# Patient Record
Sex: Male | Born: 1945 | Race: White | Hispanic: No | Smoking: Never smoker
Health system: Southern US, Community
[De-identification: ages and names within clinical notes are randomized; demographics above are authoritative.]

## PROBLEM LIST (undated history)

## (undated) DIAGNOSIS — C61 Malignant neoplasm of prostate: Secondary | ICD-10-CM

## (undated) DIAGNOSIS — C4490 Unspecified malignant neoplasm of skin, unspecified: Secondary | ICD-10-CM

## (undated) DIAGNOSIS — I10 Essential (primary) hypertension: Secondary | ICD-10-CM

## (undated) DIAGNOSIS — E039 Hypothyroidism, unspecified: Secondary | ICD-10-CM

## (undated) HISTORY — PX: CATARACT EXTRACTION, BILATERAL: SHX1313

## (undated) HISTORY — DX: Essential (primary) hypertension: I10

## (undated) HISTORY — DX: Unspecified malignant neoplasm of skin, unspecified: C44.90

## (undated) HISTORY — DX: Malignant neoplasm of prostate: C61

## (undated) HISTORY — DX: Hypothyroidism, unspecified: E03.9

---

## 2013-08-18 ENCOUNTER — Encounter (HOSPITAL_COMMUNITY): Payer: Self-pay | Admitting: *Deleted

## 2013-08-18 ENCOUNTER — Emergency Department (HOSPITAL_COMMUNITY)
Admission: EM | Admit: 2013-08-18 | Discharge: 2013-08-18 | Disposition: A | Discharge status: Home / Self Care | Payer: Self-pay | Attending: Emergency Medicine | Admitting: Emergency Medicine

## 2013-08-18 ENCOUNTER — Emergency Department (HOSPITAL_COMMUNITY): Payer: Self-pay

## 2013-08-18 DIAGNOSIS — Z79899 Other long term (current) drug therapy: Secondary | ICD-10-CM | POA: Insufficient documentation

## 2013-08-18 DIAGNOSIS — W11XXXA Fall on and from ladder, initial encounter: Secondary | ICD-10-CM | POA: Insufficient documentation

## 2013-08-18 DIAGNOSIS — Y9289 Other specified places as the place of occurrence of the external cause: Secondary | ICD-10-CM | POA: Insufficient documentation

## 2013-08-18 DIAGNOSIS — S52599A Other fractures of lower end of unspecified radius, initial encounter for closed fracture: Secondary | ICD-10-CM | POA: Insufficient documentation

## 2013-08-18 DIAGNOSIS — S52501A Unspecified fracture of the lower end of right radius, initial encounter for closed fracture: Secondary | ICD-10-CM

## 2013-08-18 DIAGNOSIS — Y9389 Activity, other specified: Secondary | ICD-10-CM | POA: Insufficient documentation

## 2013-08-18 MED ORDER — OXYCODONE-ACETAMINOPHEN 5-325 MG PO TABS: 1.0000 | ORAL_TABLET | Freq: Four times a day (QID) | ORAL | Status: DC | PRN

## 2013-08-18 MED ORDER — ONDANSETRON 4 MG PO TBDP
4.0000 mg | ORAL_TABLET | Freq: Once | ORAL | Status: AC
  Administered 2013-08-18: 4 mg via ORAL
  Filled 2013-08-18: qty 1

## 2013-08-18 MED ORDER — ONDANSETRON HCL 4 MG PO TABS: 4.0000 mg | ORAL_TABLET | Freq: Four times a day (QID) | ORAL | Status: DC

## 2013-08-18 MED ORDER — OXYCODONE-ACETAMINOPHEN 5-325 MG PO TABS
2.0000 | ORAL_TABLET | Freq: Once | ORAL | Status: AC
  Administered 2013-08-18: 2 via ORAL
  Filled 2013-08-18: qty 2

## 2013-08-18 NOTE — ED Provider Notes (Signed)
CSN: [id number]     Arrival date & time 08/18/13  1713 History  This chart was scribed for non-physician practitioner Marlon Pel, PA-C working with Dagmar Hait, MD by Danella Maiers, ED Scribe. This patient was seen in room TR10C/TR10C and the patient's care was started at 5:31 PM.   Chief Complaint  Patient presents with  . Wrist Pain   The history is provided by the patient. No language interpreter was used.   HPI Comments: Jon Ramirez is a 67 y.o. male who presents to the Emergency Department complaining of worsening, sudden-onset right hand and wrist pain after falling off a ladder from 6 feet two days ago. The ladder was on an unstable branch and he reports going straight down.  Pt reports he broke the fall with his hands. He didn't loose consciousness or have head/neck injury. He didn't get seen at the time because the pain was not significant. He felt relatively fine yesterday and then today the swelling and pain became severe and he was unable to tolerate it any long. He has been taking ibuprofen which is not working. Pt still has ring on right hand ring finger that he is unable to get off. He denies having any other complaints. He denies being unable to feel or move his fingers. He denies significant past medical history. NAD/VSS     History reviewed. No pertinent past medical history. History reviewed. No pertinent past surgical history. History reviewed. No pertinent family history. History  Substance Use Topics  . Smoking status: Never Smoker   . Smokeless tobacco: Not on file  . Alcohol Use: No    Review of Systems  Musculoskeletal: Positive for arthralgias (right wrist).  Neurological: Negative for syncope.  All other systems reviewed and are negative.    Allergies  Review of patient's allergies indicates no known allergies.  Home Medications   Current Outpatient Rx  Name  Route  Sig  Dispense  Refill  . ibuprofen (ADVIL,MOTRIN) 200 MG tablet    Oral   Take 200 mg by mouth every 6 (six) hours as needed for pain (pain).         . Multiple Vitamins-Minerals (MULTIVITAMIN WITH MINERALS) tablet   Oral   Take 1 tablet by mouth daily.          BP 161/78  Pulse 88  Temp(Src) 98.4 F (36.9 C) (Oral)  Resp 20  SpO2 98% Physical Exam  Nursing note and vitals reviewed. Constitutional: He is oriented to person, place, and time. He appears well-developed and well-nourished. No distress.  HENT:  Head: Normocephalic and atraumatic.  Eyes: EOM are normal.  Neck: Neck supple. No tracheal deviation present.  Cardiovascular: Normal rate.   Pulmonary/Chest: Effort normal. No respiratory distress.  Musculoskeletal: Normal range of motion.  Patient has significant swelling and some deformity to the right wrist. He is unable to tolerate moving his wrist. He has intact sensation to all 5 fingers with cap refill < 3 second. The fingers are all warm, moist, and pink.  He is unable to tolerate evaluating grip strength.  Neurological: He is alert and oriented to person, place, and time.  Skin: Skin is warm and dry.  Psychiatric: He has a normal mood and affect. His behavior is normal.    ED Course  Procedures (including critical care time) Medications  oxyCODONE-acetaminophen (PERCOCET/ROXICET) 5-325 MG per tablet 2 tablet (not administered)  ondansetron (ZOFRAN-ODT) disintegrating tablet 4 mg (not administered)    DIAGNOSTIC STUDIES: Oxygen Saturation is  98% on RA, normal by my interpretation.    COORDINATION OF CARE: 6:33 PM- Discussed treatment plan with pt which includes consulting Dr. Izora Ribas, the hand surgeon, and treatment with pain meds. Pt agrees to plan. Pt given 2 Percocet's for pain, declined shot of Morphine.  6:57 PM- Dr. Izora Ribas consulted to discuss fracture.  pt to call Dr. Debby Bud office tmrw to schedule follow-up appointment    Labs Review Labs Reviewed - No data to display Imaging Review Dg Wrist Complete  Right  08/18/2013   CLINICAL DATA:  Fall from ladder. Wrist pain.  EXAM: RIGHT WRIST - COMPLETE 3+ VIEW  COMPARISON:  None.  FINDINGS: Comminuted distal radius fracture is present with intra-articular extension to the radiocarpal joint and distal radioulnar joint. Mildly displaced ulnar styloid fracture is present. There is apex volar angulation of the distal radius fracture with complete loss of normal volar tibial and obvious soft tissue swelling. Dorsal hematoma is present at the wrist. STT joint osteoarthritis is present. Carpal bones appear intact.  IMPRESSION: Comminuted apex dorsally angulated distal radius fracture with intra-articular extension. Mildly displaced ulnar styloid fracture.   Electronically Signed   By: Andreas Newport M.D.   On: 08/18/2013 18:24   Dg Hand Complete Right  08/18/2013   CLINICAL DATA:  Fall. Hand pain. Wrist pain.  EXAM: RIGHT HAND - COMPLETE 3+ VIEW  COMPARISON:  Wrist radiographs today.  FINDINGS: Alignment of bones of the hand is anatomic. STT joint osteoarthritis. Metacarpals and phalanges appear intact. The triquetrum appears normal.  IMPRESSION: Distal radius fracture.   Electronically Signed   By: Andreas Newport M.D.   On: 08/18/2013 18:25    MDM   1. Distal radial fracture, right, closed, initial encounter      RING REMOVAL lubraicant was applied to patients right ring finger and ring was removed without any adverse event. Patients fingers swelling decreased. Cap refill is < 2 seconds.   Right short arm sugar tong applied, sling given for comfort.  Rx: percocet and zofran.  66 y.o.Jon Ramirez's evaluation in the Emergency Department is complete. It has been determined that no acute conditions requiring further emergency intervention are present at this time. The patient/guardian have been advised of the diagnosis and plan. We have discussed signs and symptoms that warrant return to the ED, such as changes or worsening in symptoms.  Vital signs  are stable at discharge. Filed Vitals:   08/18/13 1724  BP: 161/78  Pulse: 88  Temp: 98.4 F (36.9 C)  Resp: 20    Patient/guardian has voiced understanding and agreed to follow-up with the PCP or specialist.  I personally performed the services described in this documentation, which was scribed in my presence. The recorded information has been reviewed and is accurate.     Dorthula Matas, PA-C 08/18/13 1904

## 2013-08-18 NOTE — ED Provider Notes (Signed)
Medical screening examination/treatment/procedure(s) were performed by non-physician practitioner and as supervising physician I was immediately available for consultation/collaboration.   Dagmar Hait, MD 08/18/13 [phone]

## 2013-08-18 NOTE — ED Notes (Signed)
Patient transported to X-ray 

## 2013-08-18 NOTE — Progress Notes (Signed)
Orthopedic Tech Progress Note Patient Details:  Jon Ramirez 1946/01/05 [id number]  Ortho Devices Type of Ortho Device: Ace wrap;Arm sling;Sugartong splint Ortho Device/Splint Location: rue Ortho Device/Splint Interventions: Application   Jon Ramirez 08/18/2013, 7:22 PM

## 2013-08-18 NOTE — ED Notes (Signed)
Pt reports falling on Friday and breaking his fall with hands, having pain to right hand and wrist since, swelling noted, possible fx to right wrist. Cms intact, +right radial pulse and able to move digits.

## 2014-01-30 ENCOUNTER — Other Ambulatory Visit: Payer: Self-pay | Admitting: Family Medicine

## 2014-01-30 DIAGNOSIS — Z139 Encounter for screening, unspecified: Secondary | ICD-10-CM

## 2014-02-07 ENCOUNTER — Ambulatory Visit: Payer: Self-pay

## 2015-02-03 ENCOUNTER — Inpatient Hospital Stay (HOSPITAL_COMMUNITY)
Admission: EM | Admit: 2015-02-03 | Discharge: 2015-02-05 | DRG: 377 | Discharge status: Against Medical Advice | Payer: Medicare Other | Attending: Oncology | Admitting: Oncology

## 2015-02-03 ENCOUNTER — Other Ambulatory Visit (HOSPITAL_COMMUNITY): Payer: Self-pay

## 2015-02-03 ENCOUNTER — Emergency Department (HOSPITAL_COMMUNITY): Payer: Medicare Other

## 2015-02-03 ENCOUNTER — Encounter (HOSPITAL_COMMUNITY)
Admission: EM | Discharge status: Against Medical Advice | Payer: Self-pay | Source: Home / Self Care | Attending: Oncology

## 2015-02-03 ENCOUNTER — Encounter (HOSPITAL_COMMUNITY): Payer: Self-pay | Admitting: Emergency Medicine

## 2015-02-03 DIAGNOSIS — E43 Unspecified severe protein-calorie malnutrition: Secondary | ICD-10-CM | POA: Diagnosis present

## 2015-02-03 DIAGNOSIS — Z6824 Body mass index (BMI) 24.0-24.9, adult: Secondary | ICD-10-CM | POA: Diagnosis not present

## 2015-02-03 DIAGNOSIS — B86 Scabies: Secondary | ICD-10-CM | POA: Diagnosis present

## 2015-02-03 DIAGNOSIS — E876 Hypokalemia: Secondary | ICD-10-CM | POA: Diagnosis present

## 2015-02-03 DIAGNOSIS — L821 Other seborrheic keratosis: Secondary | ICD-10-CM | POA: Diagnosis present

## 2015-02-03 DIAGNOSIS — K254 Chronic or unspecified gastric ulcer with hemorrhage: Principal | ICD-10-CM | POA: Diagnosis present

## 2015-02-03 DIAGNOSIS — Z79899 Other long term (current) drug therapy: Secondary | ICD-10-CM | POA: Diagnosis not present

## 2015-02-03 DIAGNOSIS — R21 Rash and other nonspecific skin eruption: Secondary | ICD-10-CM

## 2015-02-03 DIAGNOSIS — K210 Gastro-esophageal reflux disease with esophagitis: Secondary | ICD-10-CM | POA: Diagnosis present

## 2015-02-03 DIAGNOSIS — K920 Hematemesis: Secondary | ICD-10-CM | POA: Diagnosis present

## 2015-02-03 DIAGNOSIS — K921 Melena: Secondary | ICD-10-CM | POA: Diagnosis present

## 2015-02-03 DIAGNOSIS — R1013 Epigastric pain: Secondary | ICD-10-CM | POA: Diagnosis present

## 2015-02-03 DIAGNOSIS — K922 Gastrointestinal hemorrhage, unspecified: Secondary | ICD-10-CM | POA: Diagnosis present

## 2015-02-03 HISTORY — PX: ESOPHAGOGASTRODUODENOSCOPY: SHX5428

## 2015-02-03 LAB — URINALYSIS, ROUTINE W REFLEX MICROSCOPIC
Bilirubin Urine: NEGATIVE
GLUCOSE, UA: 100 mg/dL — AB
Hgb urine dipstick: NEGATIVE
Ketones, ur: 15 mg/dL — AB
Leukocytes, UA: NEGATIVE
Nitrite: NEGATIVE
Protein, ur: 100 mg/dL — AB
SPECIFIC GRAVITY, URINE: 1.021 (ref 1.005–1.030)
UROBILINOGEN UA: 1 mg/dL (ref 0.0–1.0)
pH: 8.5 — ABNORMAL HIGH (ref 5.0–8.0)

## 2015-02-03 LAB — ABO/RH: ABO/RH(D): O NEG

## 2015-02-03 LAB — CBC
HEMATOCRIT: 28.8 % — AB (ref 39.0–52.0)
HEMATOCRIT: 24.7 % — AB (ref 39.0–52.0)
HEMOGLOBIN: 9.7 g/dL — AB (ref 13.0–17.0)
Hemoglobin: 8.4 g/dL — ABNORMAL LOW (ref 13.0–17.0)
MCH: 29.4 pg (ref 26.0–34.0)
MCH: 29.8 pg (ref 26.0–34.0)
MCHC: 33.7 g/dL (ref 30.0–36.0)
MCHC: 34 g/dL (ref 30.0–36.0)
MCV: 87.3 fL (ref 78.0–100.0)
MCV: 87.6 fL (ref 78.0–100.0)
Platelets: 459 10*3/uL — ABNORMAL HIGH (ref 150–400)
Platelets: 369 10*3/uL (ref 150–400)
RBC: 3.3 MIL/uL — ABNORMAL LOW (ref 4.22–5.81)
RBC: 2.82 MIL/uL — ABNORMAL LOW (ref 4.22–5.81)
RDW: 14.2 % (ref 11.5–15.5)
RDW: 14.3 % (ref 11.5–15.5)
WBC: 12.2 10*3/uL — AB (ref 4.0–10.5)
WBC: 9.9 10*3/uL (ref 4.0–10.5)

## 2015-02-03 LAB — COMPREHENSIVE METABOLIC PANEL
ALBUMIN: 3.3 g/dL — AB (ref 3.5–5.2)
ALK PHOS: 83 U/L (ref 39–117)
ALT: 15 U/L (ref 0–53)
AST: 21 U/L (ref 0–37)
Anion gap: 12 (ref 5–15)
BUN: 11 mg/dL (ref 6–23)
CHLORIDE: 99 mmol/L (ref 96–112)
CO2: 29 mmol/L (ref 19–32)
CREATININE: 1.16 mg/dL (ref 0.50–1.35)
Calcium: 8.5 mg/dL (ref 8.4–10.5)
GFR calc Af Amer: 74 mL/min — ABNORMAL LOW (ref >90)
GFR calc non Af Amer: 64 mL/min — ABNORMAL LOW (ref >90)
Glucose, Bld: 136 mg/dL — ABNORMAL HIGH (ref 70–99)
Potassium: 2.5 mmol/L — CL (ref 3.5–5.1)
Sodium: 140 mmol/L (ref 135–145)
Total Bilirubin: 0.9 mg/dL (ref 0.3–1.2)
Total Protein: 6.4 g/dL (ref 6.0–8.3)

## 2015-02-03 LAB — D-DIMER, QUANTITATIVE: D-Dimer, Quant: 1.35 ug/mL-FEU — ABNORMAL HIGH (ref 0.00–0.48)

## 2015-02-03 LAB — URINE MICROSCOPIC-ADD ON

## 2015-02-03 LAB — TYPE AND SCREEN
ABO/RH(D): O NEG
ANTIBODY SCREEN: NEGATIVE

## 2015-02-03 LAB — LIPASE, BLOOD: Lipase: 26 U/L (ref 11–59)

## 2015-02-03 LAB — POC OCCULT BLOOD, ED: Fecal Occult Bld: POSITIVE — AB

## 2015-02-03 LAB — TROPONIN I: Troponin I: <0.03 ng/mL (ref <0.031)

## 2015-02-03 SURGERY — EGD (ESOPHAGOGASTRODUODENOSCOPY)
Anesthesia: Moderate Sedation

## 2015-02-03 MED ORDER — POTASSIUM CHLORIDE 10 MEQ/100ML IV SOLN
10.0000 meq | INTRAVENOUS | Status: AC
  Administered 2015-02-03 – 2015-02-04 (×2): 10 meq via INTRAVENOUS
  Filled 2015-02-03 (×2): qty 100

## 2015-02-03 MED ORDER — FENTANYL CITRATE 0.05 MG/ML IJ SOLN
INTRAMUSCULAR | Status: DC | PRN
  Administered 2015-02-03 (×2): 25 ug via INTRAVENOUS

## 2015-02-03 MED ORDER — MIDAZOLAM HCL 10 MG/2ML IJ SOLN
INTRAMUSCULAR | Status: DC | PRN
  Administered 2015-02-03 (×2): 2 mg via INTRAVENOUS
  Administered 2015-02-03: 1 mg via INTRAVENOUS

## 2015-02-03 MED ORDER — ONDANSETRON HCL 4 MG PO TABS
4.0000 mg | ORAL_TABLET | Freq: Four times a day (QID) | ORAL | Status: DC | PRN
Start: 2015-02-03 — End: 2015-02-05

## 2015-02-03 MED ORDER — POTASSIUM CHLORIDE 10 MEQ/100ML IV SOLN
10.0000 meq | Freq: Once | INTRAVENOUS | Status: AC
  Administered 2015-02-03: 10 meq via INTRAVENOUS
  Filled 2015-02-03: qty 100

## 2015-02-03 MED ORDER — SODIUM CHLORIDE 0.9 % IV SOLN
80.0000 mg | Freq: Once | INTRAVENOUS | Status: AC
  Administered 2015-02-03: 80 mg via INTRAVENOUS
  Filled 2015-02-03: qty 80

## 2015-02-03 MED ORDER — MIDAZOLAM HCL 5 MG/ML IJ SOLN
INTRAMUSCULAR | Status: AC
  Filled 2015-02-03: qty 2

## 2015-02-03 MED ORDER — SODIUM CHLORIDE 0.9 % IV BOLUS (SEPSIS)
1000.0000 mL | Freq: Once | INTRAVENOUS | Status: AC
  Administered 2015-02-03: 1000 mL via INTRAVENOUS

## 2015-02-03 MED ORDER — IOHEXOL 350 MG/ML SOLN
100.0000 mL | Freq: Once | INTRAVENOUS | Status: AC | PRN
  Administered 2015-02-03: 100 mL via INTRAVENOUS

## 2015-02-03 MED ORDER — BUTAMBEN-TETRACAINE-BENZOCAINE 2-2-14 % EX AERO
INHALATION_SPRAY | CUTANEOUS | Status: DC | PRN
  Administered 2015-02-03: 2 via TOPICAL

## 2015-02-03 MED ORDER — SODIUM CHLORIDE 0.9 % IV SOLN
INTRAVENOUS | Status: DC
  Administered 2015-02-03: via INTRAVENOUS

## 2015-02-03 MED ORDER — PANTOPRAZOLE SODIUM 40 MG IV SOLR
8.0000 mg/h | INTRAVENOUS | Status: DC
  Administered 2015-02-03 – 2015-02-04 (×3): 8 mg/h via INTRAVENOUS
  Filled 2015-02-03 (×9): qty 80

## 2015-02-03 MED ORDER — PANTOPRAZOLE SODIUM 40 MG IV SOLR: 40.0000 mg | Freq: Two times a day (BID) | INTRAVENOUS | Status: DC

## 2015-02-03 MED ORDER — FENTANYL CITRATE 0.05 MG/ML IJ SOLN
INTRAMUSCULAR | Status: AC
  Filled 2015-02-03: qty 2

## 2015-02-03 MED ORDER — SODIUM CHLORIDE 0.9 % IV SOLN: INTRAVENOUS | Status: DC

## 2015-02-03 MED ORDER — DIPHENHYDRAMINE HCL 50 MG/ML IJ SOLN
INTRAMUSCULAR | Status: AC
  Filled 2015-02-03: qty 1

## 2015-02-03 MED ORDER — PERMETHRIN 5 % EX CREA
TOPICAL_CREAM | Freq: Once | CUTANEOUS | Status: AC
  Administered 2015-02-04: 09:00:00 via TOPICAL
  Filled 2015-02-03: qty 60

## 2015-02-03 MED ORDER — PANTOPRAZOLE SODIUM 40 MG IV SOLR
40.0000 mg | Freq: Once | INTRAVENOUS | Status: AC
  Administered 2015-02-03: 40 mg via INTRAVENOUS
  Filled 2015-02-03: qty 40

## 2015-02-03 MED ORDER — ONDANSETRON HCL 4 MG PO TABS: 4.0000 mg | ORAL_TABLET | Freq: Four times a day (QID) | ORAL | Status: DC

## 2015-02-03 MED ORDER — POTASSIUM CHLORIDE 10 MEQ/100ML IV SOLN
10.0000 meq | INTRAVENOUS | Status: AC
Start: 2015-02-03 — End: 2015-02-03

## 2015-02-03 NOTE — ED Notes (Addendum)
Pt presents with rash to upper torso for the past 8 weeks- pt has been treating himself at home by rubbing pesticide on skin.  Pt reports he has developed a metallic taste in his mouth, denies pain.  Pt reports developing dark tarry stools 4 days ago.  No respiratory distress noted at present.

## 2015-02-03 NOTE — Progress Notes (Signed)
Pt transferred from the ED around 2030, admitted to Rm/3s04. He is alert and oriented x4. No skin breakdown noted however he dose have a generalized rash with itch. Pt refusing treatment for rash at this time, would like to wait until tomorrow, MD aware. Placed on telemetry, currently NSR. Oriented to room, instructed to call for assistance before getting out of bed. Resting comfortably at this time, will continue to monitor

## 2015-02-03 NOTE — Consult Note (Signed)
Referring Provider: Dr. Dina Rich Primary Care Physician:  No primary care provider on file. Primary Gastroenterologist:  Dr. Michail Sermon  Reason for Consultation:  GI bleed  HPI: Jon Ramirez is a 69 y.o. male presenting with melena for the past 7 days and episodes of vomiting that started this morning. Vomiting was initially clear but the last episode had red blood in it and he feels like he had about 2 cups of blood with that episode. BMs have been loose and black daily for the past 7 days. Denies abdominal pain but feels like his abdomen is bloated. Has been feeling lightheaded and weak. 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 Denies fevers, chills, chest pain. Went to [place] and [place] 2 months ago. Recently developed a full body rash that he has been self-treating. Denies alcohol. Uses Advil prn. No history of ulcers. Colonoscopy in 2015 showed internal hemorrhoids.  History reviewed. No pertinent past medical history.  History reviewed. No pertinent past surgical history.  Prior to Admission medications   Medication Sig Start Date End Date Taking? Authorizing Provider  ibuprofen (ADVIL,MOTRIN) 200 MG tablet Take 200 mg by mouth every 6 (six) hours as needed for pain (pain).   Yes Historical Provider, MD  ondansetron (ZOFRAN) 4 MG tablet Take 1 tablet (4 mg total) by mouth every 6 (six) hours. Patient not taking: Reported on 02/03/2015 08/18/13   Delos Haring, PA-C  oxyCODONE-acetaminophen (PERCOCET/ROXICET) 5-325 MG per tablet Take 1-2 tablets by mouth every 6 (six) hours as needed for pain. Patient not taking: Reported on 02/03/2015 08/18/13   Delos Haring, PA-C    Scheduled Meds:  Continuous Infusions: . sodium chloride     PRN Meds:.  Allergies as of 02/03/2015  . (No Known Allergies)    No family history on file.  History   Social History  . Marital Status: Divorced    Spouse Name: N/A  .  Number of Children: N/A  . Years of Education: N/A   Occupational History  . Not on file.   Social History Main Topics  . Smoking status: Never Smoker   . Smokeless tobacco: Not on file  . Alcohol Use: No  . Drug Use: No  . Sexual Activity: Not on file   Other Topics Concern  . Not on file   Social History Narrative    Review of Systems: All negative from a GI standpoint except as stated above in HPI.  Physical Exam: Vital signs: Filed Vitals:   02/03/15 1200  BP: 136/69  Pulse: 92  Temp: 97  Resp: 16     General:   Lethargic, Well-developed, well-nourished, pleasant and cooperative in NAD HEENT: anicteric Neck: supple, nontender Lungs:  Clear throughout to auscultation.   No wheezes, crackles, or rhonchi. No acute distress. Heart:  Regular rate and rhythm; no murmurs, clicks, rubs,  or gallops. Abdomen: soft, nontender, nondistended, +BS  Rectal:  Deferred Ext: no edema Skin: diffuse papular rash  GI:  Lab Results:  Recent Labs  02/03/15 0228  WBC 12.2*  HGB 9.7*  HCT 28.8*  PLT 459*   BMET  Recent Labs  02/03/15 0228  NA 140  K 2.5*  CL 99  CO2 29  GLUCOSE 136*  BUN 11  CREATININE 1.16  CALCIUM 8.5   LFT  Recent Labs  02/03/15 0228  PROT 6.4  ALBUMIN 3.3*  AST 21  ALT 15  ALKPHOS 83  BILITOT  0.9   PT/INR No results for input(s): LABPROT, INR in the last 72 hours.   Studies/Results: Dg Chest 2 View  02/03/2015   CLINICAL DATA:  Difficulty breathing after exposure to insecticide  EXAM: CHEST  2 VIEW  COMPARISON:  None.  FINDINGS: Lungs are clear. Heart size and pulmonary vascularity are normal. No adenopathy. No bone lesions.  IMPRESSION: No edema or consolidation.   Electronically Signed   By: Lowella Grip III M.D.   On: 02/03/2015 08:52   Dg Abd 1 View  02/03/2015   CLINICAL DATA:  Recent insect aside exposure with rash  EXAM: ABDOMEN - 1 VIEW  COMPARISON:  None.  FINDINGS: Scattered large and small bowel gas is noted.  Rim-like calcifications are noted in the right upper quadrant likely related to gallstones. No obstructive changes are seen. Mild amount of fecal material is noted within the rectum. Degenerative changes of the lumbar spine are seen with a mild scoliosis concave to the left.  IMPRESSION: Mild degenerative changes of the lumbar spine.  Cholelithiasis.   Electronically Signed   By: Inez Catalina M.D.   On: 02/03/2015 08:58   Ct Angio Chest Pe W/cm &/or Wo Cm  02/03/2015   CLINICAL DATA:  Chest pain and short of breath.  Rash 8 weeks  EXAM: CT ANGIOGRAPHY CHEST WITH CONTRAST  TECHNIQUE: Multidetector CT imaging of the chest was performed using the standard protocol during bolus administration of intravenous contrast. Multiplanar CT image reconstructions and MIPs were obtained to evaluate the vascular anatomy.  CONTRAST:  198mL OMNIPAQUE IOHEXOL 350 MG/ML SOLN  COMPARISON:  Chest x-ray 02/03/2015  FINDINGS: Negative for pulmonary emboli. Negative for aortic dissection or aneurysm. Minimal atherosclerotic disease in the aortic arch. Negative for pericardial effusion. Heart size is upper normal.  Lungs are clear without infiltrate or effusion.  Negative for mass or adenopathy.  Calcified gallstones.  No acute skeletal abnormality.  Review of the MIP images confirms the above findings.  IMPRESSION: Negative for pulmonary embolism.  No acute abnormality in the chest  Cholelithiasis.   Electronically Signed   By: Franchot Gallo M.D.   On: 02/03/2015 10:06    Impression/Plan: 69 yo with melena and hematemesis concerning for a peptic ulcer bleed vs. Mallory-Weiss tear. Hemodynamically stable. EGD today. Protonix drip. NPO. Supportive care. Management of rash defer to primary team but I do not think it is related to his GI symptoms.      [place] C.  02/03/2015, 12:14 PM

## 2015-02-03 NOTE — ED Provider Notes (Signed)
CSN: [id number]     Arrival date & time 02/03/15  0211 History   First MD Initiated Contact with Patient 02/03/15 [phone]     Chief Complaint  Patient presents with  . Rash     (Consider location/radiation/quality/duration/timing/severity/associated sxs/prior Treatment) The history is provided by the patient. No language interpreter was used.  Jon Ramirez is a 69 y/o M with PMhx of IBS presenting to the ED with rash, hematemesis, melena. Patient reported that he recently traveled to [place] in [place] approximately 2 months ago. Patient reported that he noticed a rash on his lower abdomen described as red raised lesions that were extremely itchy, started approximately 8 weeks ago. Patient reported that he did some research and diagnosed himself with scabies. Patient reported that he purchased permethrin 10% solution on the Internet, stated that he's been applying this solution at least one time per day for the past 2 weeks-reported that he dilutes this solution with water. Patient reported that the rash has went widespread from his torso up-reported that he has not been able to sleep secondary to the previous. Patient reported that he's been experiencing nausea, vomiting, melena for approximately one week. Patient reported that he has been having episodes of emesis-reported that over the past 2 days he has had bright red blood in his emesis, reported that the emesis is blood-tinged with no blood clots. Stated that he's been having a metallic taste in his mouth. Reported that he's been having melenic stools with each bowel movement. Stated that he's been feeling lightheaded and fatigued. Reported that he's been experiencing chest tightness approximately 3-4 days ago worse with activity, but reported that it is present with rest. Denied fever, chills, neck pain, neck stiffness, chest pain, dental pain, hematochezia, numbness, tingling, weakness, dysuria, hematuria, urine decreased. Denied chronic NSAID use,  blood thinners.  PCP none  History reviewed. No pertinent past medical history. History reviewed. No pertinent past surgical history. No family history on file. History  Substance Use Topics  . Smoking status: Never Smoker   . Smokeless tobacco: Not on file  . Alcohol Use: No    Review of Systems  Constitutional: Negative for fever and chills.  HENT: Positive for sore throat.   Eyes: Negative for visual disturbance.  Respiratory: Positive for chest tightness and shortness of breath.   Cardiovascular: Negative for chest pain.  Gastrointestinal: Positive for nausea, vomiting and blood in stool. Negative for abdominal pain, diarrhea, constipation and anal bleeding.  Genitourinary: Negative for dysuria, hematuria and decreased urine volume.  Musculoskeletal: Negative for back pain, neck pain and neck stiffness.  Neurological: Positive for light-headedness. Negative for dizziness.      Allergies  Review of patient's allergies indicates no known allergies.  Home Medications   Prior to Admission medications   Medication Sig Start Date End Date Taking? Authorizing Provider  ibuprofen (ADVIL,MOTRIN) 200 MG tablet Take 200 mg by mouth every 6 (six) hours as needed for pain (pain).    Historical Provider, MD  Multiple Vitamins-Minerals (MULTIVITAMIN WITH MINERALS) tablet Take 1 tablet by mouth daily.    Historical Provider, MD  ondansetron (ZOFRAN) 4 MG tablet Take 1 tablet (4 mg total) by mouth every 6 (six) hours. 08/18/13   Delos Haring, PA-C  oxyCODONE-acetaminophen (PERCOCET/ROXICET) 5-325 MG per tablet Take 1-2 tablets by mouth every 6 (six) hours as needed for pain. 08/18/13   Tiffany Carlota Raspberry, PA-C   BP 136/74 mmHg  Pulse 91  Temp(Src) 97 F (36.1 C) (Oral)  Resp 23  SpO2 98% Physical Exam  Constitutional: He is oriented to person, place, and time. He appears well-developed and well-nourished. No distress.  HENT:  Head: Normocephalic and atraumatic.  Mouth/Throat:  Oropharynx is clear and moist. No oropharyngeal exudate.  Eyes: Conjunctivae and EOM are normal. Pupils are equal, round, and reactive to light. Right eye exhibits no discharge. Left eye exhibits no discharge.  Neck: Normal range of motion. Neck supple.  Cardiovascular: Regular rhythm and normal heart sounds.  Tachycardia present.   Pulses:      Radial pulses are 2+ on the right side, and 2+ on the left side.       Dorsalis pedis pulses are 2+ on the right side, and 2+ on the left side.  Cap refill < 3 seconds Negative swelling or edema noted to the lower extremities bilaterally   Pulmonary/Chest: Effort normal and breath sounds normal. No respiratory distress. He has no wheezes. He has no rales. He exhibits no tenderness.  Abdominal: Soft. Bowel sounds are normal. He exhibits no distension. There is no tenderness. There is no rebound and no guarding.  Genitourinary:  Rectal Exam: Black tarry stools identified on patient's underwear. Hemorrhoid identified into the anus-negative thrombosis noted. Exam chaperoned with nurse  Musculoskeletal: Normal range of motion.  Neurological: He is alert and oriented to person, place, and time. No cranial nerve deficit. He exhibits normal muscle tone. Coordination normal. GCS eye subscore is 4. GCS verbal subscore is 5. GCS motor subscore is 6.  Cranial nerves grossly intact  Equal grip strength bilaterally Patient follow commands well Patient responds to questions appropriately Strength 5+/5+ to upper and lower extremities  Skin: Skin is warm and dry. Rash noted. He is not diaphoretic. No erythema.  Small red raised lesions identified to the torso, back, upper extremities-exclude space. Scabs identified secondary from itching. Negative active drainage or bleeding noted. Negative red streaks. Negative sinus cellulitic infection.  Psychiatric: He has a normal mood and affect. His behavior is normal. Thought content normal.  Nursing note and vitals  reviewed.   ED Course  Procedures (including critical care time)  Results for orders placed or performed during the hospital encounter of 02/03/15  CBC  Result Value Ref Range   WBC 12.2 (H) 4.0 - 10.5 K/uL   RBC 3.30 (L) 4.22 - 5.81 MIL/uL   Hemoglobin 9.7 (L) 13.0 - 17.0 g/dL   HCT 28.8 (L) 39.0 - 52.0 %   MCV 87.3 78.0 - 100.0 fL   MCH 29.4 26.0 - 34.0 pg   MCHC 33.7 30.0 - 36.0 g/dL   RDW 14.2 11.5 - 15.5 %   Platelets 459 (H) 150 - 400 K/uL  Comprehensive metabolic panel  Result Value Ref Range   Sodium 140 135 - 145 mmol/L   Potassium 2.5 (LL) 3.5 - 5.1 mmol/L   Chloride 99 96 - 112 mmol/L   CO2 29 19 - 32 mmol/L   Glucose, Bld 136 (H) 70 - 99 mg/dL   BUN 11 6 - 23 mg/dL   Creatinine, Ser 1.16 0.50 - 1.35 mg/dL   Calcium 8.5 8.4 - 10.5 mg/dL   Total Protein 6.4 6.0 - 8.3 g/dL   Albumin 3.3 (L) 3.5 - 5.2 g/dL   AST 21 0 - 37 U/L   ALT 15 0 - 53 U/L   Alkaline Phosphatase 83 39 - 117 U/L   Total Bilirubin 0.9 0.3 - 1.2 mg/dL   GFR calc non Af Amer 64 (L) >90 mL/min   GFR  calc Af Amer 74 (L) >90 mL/min   Anion gap 12 5 - 15  Urinalysis, Routine w reflex microscopic  Result Value Ref Range   Color, Urine YELLOW YELLOW   APPearance HAZY (A) CLEAR   Specific Gravity, Urine 1.021 1.005 - 1.030   pH 8.5 (H) 5.0 - 8.0   Glucose, UA 100 (A) NEGATIVE mg/dL   Hgb urine dipstick NEGATIVE NEGATIVE   Bilirubin Urine NEGATIVE NEGATIVE   Ketones, ur 15 (A) NEGATIVE mg/dL   Protein, ur 100 (A) NEGATIVE mg/dL   Urobilinogen, UA 1.0 0.0 - 1.0 mg/dL   Nitrite NEGATIVE NEGATIVE   Leukocytes, UA NEGATIVE NEGATIVE  Troponin I  Result Value Ref Range   Troponin I <0.03 <0.031 ng/mL  Lipase, blood  Result Value Ref Range   Lipase 26 11 - 59 U/L  D-dimer, quantitative  Result Value Ref Range   D-Dimer, Quant 1.35 (H) 0.00 - 0.48 ug/mL-FEU  Urine microscopic-add on  Result Value Ref Range   WBC, UA 0-2 <3 WBC/hpf   RBC / HPF 0-2 <3 RBC/hpf   Bacteria, UA FEW (A) RARE    Urine-Other AMORPHOUS URATES/PHOSPHATES   POC occult blood, ED  Result Value Ref Range   Fecal Occult Bld POSITIVE (A) NEGATIVE    Labs Review Labs Reviewed  CBC - Abnormal; Notable for the following:    WBC 12.2 (*)    RBC 3.30 (*)    Hemoglobin 9.7 (*)    HCT 28.8 (*)    Platelets 459 (*)    All other components within normal limits  COMPREHENSIVE METABOLIC PANEL - Abnormal; Notable for the following:    Potassium 2.5 (*)    Glucose, Bld 136 (*)    Albumin 3.3 (*)    GFR calc non Af Amer 64 (*)    GFR calc Af Amer 74 (*)    All other components within normal limits  URINALYSIS, ROUTINE W REFLEX MICROSCOPIC - Abnormal; Notable for the following:    APPearance HAZY (*)    pH 8.5 (*)    Glucose, UA 100 (*)    Ketones, ur 15 (*)    Protein, ur 100 (*)    All other components within normal limits  D-DIMER, QUANTITATIVE - Abnormal; Notable for the following:    D-Dimer, Quant 1.35 (*)    All other components within normal limits  URINE MICROSCOPIC-ADD ON - Abnormal; Notable for the following:    Bacteria, UA FEW (*)    All other components within normal limits  POC OCCULT BLOOD, ED - Abnormal; Notable for the following:    Fecal Occult Bld POSITIVE (*)    All other components within normal limits  TROPONIN I  LIPASE, BLOOD  OCCULT BLOOD X 1 CARD TO LAB, STOOL  TYPE AND SCREEN    Imaging Review Dg Chest 2 View  02/03/2015   CLINICAL DATA:  Difficulty breathing after exposure to insecticide  EXAM: CHEST  2 VIEW  COMPARISON:  None.  FINDINGS: Lungs are clear. Heart size and pulmonary vascularity are normal. No adenopathy. No bone lesions.  IMPRESSION: No edema or consolidation.   Electronically Signed   By: Lowella Grip III M.D.   On: 02/03/2015 08:52   Dg Abd 1 View  02/03/2015   CLINICAL DATA:  Recent insect aside exposure with rash  EXAM: ABDOMEN - 1 VIEW  COMPARISON:  None.  FINDINGS: Scattered large and small bowel gas is noted. Rim-like calcifications are noted in  the right upper  quadrant likely related to gallstones. No obstructive changes are seen. Mild amount of fecal material is noted within the rectum. Degenerative changes of the lumbar spine are seen with a mild scoliosis concave to the left.  IMPRESSION: Mild degenerative changes of the lumbar spine.  Cholelithiasis.   Electronically Signed   By: Inez Catalina M.D.   On: 02/03/2015 08:58   Ct Angio Chest Pe W/cm &/or Wo Cm  02/03/2015   CLINICAL DATA:  Chest pain and short of breath.  Rash 8 weeks  EXAM: CT ANGIOGRAPHY CHEST WITH CONTRAST  TECHNIQUE: Multidetector CT imaging of the chest was performed using the standard protocol during bolus administration of intravenous contrast. Multiplanar CT image reconstructions and MIPs were obtained to evaluate the vascular anatomy.  CONTRAST:  139mL OMNIPAQUE IOHEXOL 350 MG/ML SOLN  COMPARISON:  Chest x-ray 02/03/2015  FINDINGS: Negative for pulmonary emboli. Negative for aortic dissection or aneurysm. Minimal atherosclerotic disease in the aortic arch. Negative for pericardial effusion. Heart size is upper normal.  Lungs are clear without infiltrate or effusion.  Negative for mass or adenopathy.  Calcified gallstones.  No acute skeletal abnormality.  Review of the MIP images confirms the above findings.  IMPRESSION: Negative for pulmonary embolism.  No acute abnormality in the chest  Cholelithiasis.   Electronically Signed   By: Franchot Gallo M.D.   On: 02/03/2015 10:06     EKG Interpretation None      8:24 AM This provider spoke with Marita Kansas from [employer] - reported that Permethrin solution 10 % is neurotoxic. Reported that it would not cause GI issues if inhaled or ingested. Marita Kansas reported that does not cause chest tightness or pulmonary issues.   8:59 AM This provider spoke with [facility] - discussed case in great detail, labs, imaging, vitals. GI to consult patient.  Phone call back stating that patient is [facility].   10:59 AM This provider spoke  with Dr. Michail Sermon, [facility]. Discussed case, labs, ED course in great detail. Gastric neurology to consult patient.  11:01 AM this provider spoke with internal medicine teaching services, Dr. Delfino Lovett. Discussed case, labs, imaging, ED course in great detail. Patient to be admitted to telemetry.  MDM   Final diagnoses:  Upper GI bleed  Hypokalemia  Rash and nonspecific skin eruption    Medications  potassium chloride 10 mEq in 100 mL IVPB (10 mEq Intravenous New Bag/Given 02/03/15 1042)  sodium chloride 0.9 % bolus 1,000 mL (not administered)  sodium chloride 0.9 % bolus 1,000 mL (0 mLs Intravenous Stopped 02/03/15 1042)  pantoprazole (PROTONIX) injection 40 mg (40 mg Intravenous Given 02/03/15 0904)  iohexol (OMNIPAQUE) 350 MG/ML injection 100 mL (100 mLs Intravenous Contrast Given 02/03/15 0924)    Filed Vitals:   02/03/15 1000 02/03/15 1015 02/03/15 1030 02/03/15 1045  BP: 137/74 141/74 146/74 136/74  Pulse: 84 88 82 91  Temp:      TempSrc:      Resp: 14 19 19 23   SpO2: 100% 100% 97% 98%   EKG noted sinus rhythm with a heart rate of 95 bpm. Negative U waves noted. Troponin negative elevation. D-dimer elevated at 1.35. CBC noted mildly elevated white blood cell count of 12.2. Hemoglobin 9.7, hematocrit 28.8. CMP noted potassium of 2.5. Negative elevated anion gap. Lipase negative elevation. Fecal occult positive. Urinalysis noted-negative hemoglobin, nitrites, leukocytes. Plain film of chest noted or consolidation identified. Abdominal plain film noted mild stranding changes the lumbar spine, cholelithiasis identified. CT angiogram of chest negative  for PE, abnormalities in the chest identified. Patient presenting to the ED with what appears to be a possible upper GI bleed secondary to hematemesis and melena. Patient given IV fluids and IV Protonix in ED setting. Lab findings identified hypokalemia with potassium level of 2.5-negative EKG changes noted. IV potassium administered in ED  setting. Rash of unknown etiology identified. Poison control consulted regarding permethrin 10% solution-reported that this is most likely to result in neurotoxicity-patient does not have any form of neurological deficits at this time. Discussed plan for admission with patient who is in accordance to plan. GI consulted and to assess patient. Patient to be admitted to telemetry floor. Patient stable for transfer to floor.   Jamse Mead, PA-C 02/03/15 1115  Merryl Hacker, MD 02/03/15 1226

## 2015-02-03 NOTE — Interval H&P Note (Signed)
History and Physical Interval Note:  02/03/2015 2:10 PM  Jon Ramirez  has presented today for surgery, with the diagnosis of GI bleed  The various methods of treatment have been discussed with the patient and family. After consideration of risks, benefits and other options for treatment, the patient has consented to  Procedure(s): ESOPHAGOGASTRODUODENOSCOPY (EGD) (N/A) as a surgical intervention .  The patient's history has been reviewed, patient examined, no change in status, stable for surgery.  I have reviewed the patient's chart and labs.  Questions were answered to the patient's satisfaction.     [place] C.

## 2015-02-03 NOTE — H&P (Signed)
Date: 02/03/2015               Patient Name:  Jon Ramirez MRN: [id number]  DOB: Sep 30, 1946 Age / Sex: 70 y.o., male   PCP: No primary care provider on file.         Medical Service: Internal Medicine Teaching Service         Attending Physician: Dr. Aldine Contes, MD    First Contact: Dr. Venita Lick Pager: [phone]  Second Contact: Dr. Bing Neighbors Pager: [phone]       After Hours (After 5p/  First Contact Pager: [phone]  weekends / holidays): Second Contact Pager: [phone]   Chief Complaint: rash for 8 weeks and hematemesis last night  History of Present Illness: Jon Ramirez is a 69 yo man with a history of irritable bowel syndrome and GERD who presented to the ED after an episode of hematemesis. He has a 10 year history of what he calls "GERD and IBS". Episodes consist of abdominal burning that is not relieved by alka seltzer or mylanta, and 5-6 rounds of vomiting that relieves the pain. He states that he these episodes occur once every 4-5 months. He had one last week and another last night. During each, he noted dark, red-tinged vomitus. He has also noted darkened stool for the last week. He denies dizziness, light-headedness or increase in his abdominal pain. He also denies current alcohol use or a significant history of alcohol use, or excessive NSAID use; he reports taking ibuprofen once every few weeks.   Also over the past 8 weeks, he has developed a progressive rash. It started in his suprapubic region and has spread up his trunk and to his arms. It is pruritic, but not painful. He initially treated it with lotion, then clorox soaks and tea tree oil. When it kept spreading, he switched two days ago to permethrin solution (10%, purchased online); the rash has responded slightly to permethrin. He has a history of what he calls "sensitive skin", as he has reacted to poison ivy in the past and has dry patches on his shins and arms from time to time. However, he denies  any new exposures, new soaps or new detergents. He also denies much sun exposure. He is not sexually active. Of note, the patient reports many recent travels, including a trip to [place], the [place] and [place]. He has no household contacts, as he lives alone, but he does report staying in a "sketchy motel" on his trip to [place]. He believes he contracted scabies there.  Meds: Current Facility-Administered Medications  Medication Dose Route Frequency Provider Last Rate Last Dose  . pantoprazole (PROTONIX) 80 mg in sodium chloride 0.9 % 100 mL IVPB  80 mg Intravenous Once Wilford Corner, MD      . pantoprazole (PROTONIX) 80 mg in sodium chloride 0.9 % 250 mL (0.32 mg/mL) infusion  8 mg/hr Intravenous Continuous Wilford Corner, MD      . Derrill Memo ON 02/07/2015] pantoprazole (PROTONIX) injection 40 mg  40 mg Intravenous Q12H Wilford Corner, MD       Current Outpatient Prescriptions  Medication Sig Dispense Refill  . ibuprofen (ADVIL,MOTRIN) 200 MG tablet Take 200 mg by mouth every 6 (six) hours as needed for pain (pain).    . ondansetron (ZOFRAN) 4 MG tablet Take 1 tablet (4 mg total) by mouth every 6 (six) hours. (Patient not taking: Reported on 02/03/2015) 12 tablet 0  . oxyCODONE-acetaminophen (PERCOCET/ROXICET) 5-325 MG per tablet Take 1-2 tablets by  mouth every 6 (six) hours as needed for pain. (Patient not taking: Reported on 02/03/2015) 30 tablet 0    Allergies: Allergies as of 02/03/2015  . (No Known Allergies)   History reviewed. No pertinent past medical history. History reviewed. No pertinent past surgical history. No family history on file. History   Social History  . Marital Status: Divorced    Spouse Name: N/A  . Number of Children: N/A  . Years of Education: N/A   Occupational History  . Not on file.   Social History Main Topics  . Smoking status: Never Smoker   . Smokeless tobacco: Not on file  . Alcohol Use: No  . Drug Use: No  . Sexual Activity: Not on  file   Other Topics Concern  . Not on file   Social History Narrative    Review of Systems: General: no recent illness, a lot of recent travel Skin: see HPI HEENT: no headaches, no changes in vision Cardiac: no chest pain or palpitations Respiratory: no shortness of breath or wheezes GI: see HPI Urinary: no changes on urination Msk: no joint pain Psychiatric: no known history of disease; patient lives alone, multiple recent travels  Physical Exam: Blood pressure 127/71, pulse 80, temperature 97 F (36.1 C), temperature source Oral, resp. rate 22, SpO2 98 %. Appearance: in NAD, lying in bed in the dark HEENT: AT/[place], PERRL, EOMi, moist mucous membranes, normal pallor Heart: RRR, normal S1S2, no murmurs Lungs: CTAB, no wheezing Abdomen: rash (as below), BS+, soft, nontender Extremities: normal range of motion, no edema Neurologic: A&Ox3, grossly intact Skin: tan skin, erythematous macular rash on abdomen, bilateral arms, back; does not involve palms or soles. No lesions in finger webbing. Multiple large SKs on back.  Right arm manifestations of rash 3/15:  Lesion on left groin 3/15:      Lab results: Basic Metabolic Panel:  Recent Labs  02/03/15 0228  NA 140  K 2.5*  CL 99  CO2 29  GLUCOSE 136*  BUN 11  CREATININE 1.16  CALCIUM 8.5   Liver Function Tests:  Recent Labs  02/03/15 0228  AST 21  ALT 15  ALKPHOS 83  BILITOT 0.9  PROT 6.4  ALBUMIN 3.3*    Recent Labs  02/03/15 0222  LIPASE 26   CBC:  Recent Labs  02/03/15 0228  WBC 12.2*  HGB 9.7*  HCT 28.8*  MCV 87.3  PLT 459*   Cardiac Enzymes:  Recent Labs  02/03/15 0708  TROPONINI <0.03   BNP: No results for input(s): PROBNP in the last 72 hours. D-Dimer:  Recent Labs  02/03/15 0803  DDIMER 1.35*   Urinalysis:  Recent Labs  02/03/15 0718  COLORURINE YELLOW  LABSPEC 1.021  PHURINE 8.5*  GLUCOSEU 100*  HGBUR NEGATIVE  BILIRUBINUR NEGATIVE  KETONESUR 15*  PROTEINUR  100*  UROBILINOGEN 1.0  NITRITE NEGATIVE  LEUKOCYTESUR NEGATIVE    Imaging results:  Dg Chest 2 View  02/03/2015   CLINICAL DATA:  Difficulty breathing after exposure to insecticide  EXAM: CHEST  2 VIEW  COMPARISON:  None.  FINDINGS: Lungs are clear. Heart size and pulmonary vascularity are normal. No adenopathy. No bone lesions.  IMPRESSION: No edema or consolidation.   Electronically Signed   By: Lowella Grip III M.D.   On: 02/03/2015 08:52   Dg Abd 1 View  02/03/2015   CLINICAL DATA:  Recent insect aside exposure with rash  EXAM: ABDOMEN - 1 VIEW  COMPARISON:  None.  FINDINGS: Scattered  large and small bowel gas is noted. Rim-like calcifications are noted in the right upper quadrant likely related to gallstones. No obstructive changes are seen. Mild amount of fecal material is noted within the rectum. Degenerative changes of the lumbar spine are seen with a mild scoliosis concave to the left.  IMPRESSION: Mild degenerative changes of the lumbar spine.  Cholelithiasis.   Electronically Signed   By: Inez Catalina M.D.   On: 02/03/2015 08:58   Ct Angio Chest Pe W/cm &/or Wo Cm  02/03/2015   CLINICAL DATA:  Chest pain and short of breath.  Rash 8 weeks  EXAM: CT ANGIOGRAPHY CHEST WITH CONTRAST  TECHNIQUE: Multidetector CT imaging of the chest was performed using the standard protocol during bolus administration of intravenous contrast. Multiplanar CT image reconstructions and MIPs were obtained to evaluate the vascular anatomy.  CONTRAST:  186mL OMNIPAQUE IOHEXOL 350 MG/ML SOLN  COMPARISON:  Chest x-ray 02/03/2015  FINDINGS: Negative for pulmonary emboli. Negative for aortic dissection or aneurysm. Minimal atherosclerotic disease in the aortic arch. Negative for pericardial effusion. Heart size is upper normal.  Lungs are clear without infiltrate or effusion.  Negative for mass or adenopathy.  Calcified gallstones.  No acute skeletal abnormality.  Review of the MIP images confirms the above  findings.  IMPRESSION: Negative for pulmonary embolism.  No acute abnormality in the chest  Cholelithiasis.   Electronically Signed   By: Franchot Gallo M.D.   On: 02/03/2015 10:06    Other results: EKG: Rate 95, normal sinus rhythm, no ST elevation or ischemia  Assessment & Plan by Problem: Principal Problem:   Upper GI bleed Active Problems:   Melena   Hematemesis   GI bleed   Rash and nonspecific skin eruption   Hypokalemia  Jon Ramirez is a 69 yo man with IBS who presented with a rash and then reported melena for the past 7 days and nausea/vomiting this morning. Though initially clear, his vomitus became bloody. He was taken for EGD and found to have a large distal stomach ulcer with bleeding stigmata.  Upper GI Bleed: Peptic ulcer bleed or Bill Mcvey-Weiss tear were considered on this patient's presentation as causes of his hematemesis and melena. On EGD, he was found to have a large distal stomach ulcer with bleeding stigmata and small adherent clot. It was not actively bleeding and clot could not be dislodged. Hemoglobin 9.7. Colonoscopy in 2015 was only significant for internal hemorrhoids. - Appreciate GI consult and EGD - Admit to SDU - Protonix infusion (40 mg BID) and bolus; will need likely need maintenance antisecretory therapy as outpatient (patient appears to have had multiple occurrences per year)  - Stop NSAID use - Heliobacter pylori abs-IgG+IgA test; if positive, can treat with triple therapy (PPI, amoxicillin and clarithromycin) - CBC q12 to trend hemoglobin - IVF NS 100 mL/hr - Telemetry  Erythematous Papular Rash: Appears to be consistent with scabies, and per patient report, has responded somewhat to permethrin solution. Could also consider syphilitic rash if he does not respond to treatment. No contacts at home.  - Consider collecting skin scraping to confirm diagnosis - Start permethrin cream 5%; apply to entire body below neck once and wash off in 8-14  hours; then repeat in 1 week  Hypokalemia: 2.5 on admission. Given one run of potassium chloride in the ED. - 2 additional runs of potassium chloride  - BMET in am  Nausea: Present this morning. - Zofran 4 mg q6 hours PRN (this is a  home medication)  History of IBS: Patient's description of his symptoms do not fit an IBS profile (he has never had diarrhea, described chest burning and occasional emesis). Question validity of this diagnosis.   Diet: NPO  DVT Ppx: SCDs  Dispo: Disposition is deferred at this time, awaiting improvement of current medical problems. Anticipated discharge in approximately 1-2 day(s).   The patient does not have a current PCP (No primary care provider on file.) and does need an [facility] hospital follow-up appointment after discharge.  The patient does not know have transportation limitations that hinder transportation to clinic appointments.  Signed: Karlene Einstein, MD 02/03/2015, 1:13 PM

## 2015-02-03 NOTE — Op Note (Signed)
[facility] [place], [id number]   ENDOSCOPY PROCEDURE REPORT  PATIENT: Jon Ramirez, Jon Ramirez  MR#: [id number] BIRTHDATE: 05/31/1946 , 68  yrs. old GENDER: male ENDOSCOPIST: Wilford Corner, MD REFERRED BY:  hospital team PROCEDURE DATE:  01-Mar-2015 PROCEDURE:  EGD, diagnostic ASA CLASS:     Class II INDICATIONS:  melena and hematemesis. MEDICATIONS: Fentanyl 50 mcg IV and Versed 5 mg IV TOPICAL ANESTHETIC: Cetacaine Spray  DESCRIPTION OF PROCEDURE: After the risks benefits and alternatives of the procedure were thoroughly explained, informed consent was obtained.  The Pentax Gastroscope [id number] endoscope was introduced through the mouth and advanced to the second portion of the duodenum , Without limitations.  The instrument was slowly withdrawn as the mucosa was fully examined. Estimated blood loss is zero unless otherwise noted in this procedure report.    Long segment of superficial linear ulcerations in distal esophagus consistent with moderate erosive esophagitis. Proximal and mid-stomach normal. A large cratered antral and prepyloric ulcer seen with a small adherent clot and small amount of dark red and black fluid onto the base of the ulcer that could not be dislodged preventing complete visualization of the ulcer base. No active bleeding was seen. Very edematous mucosa adjacent to the ulcer seen causing a mass effect of the pyloric channel. Able to advance into the duodenal bulb, which was shortened likely due to the ulcer and into the second portion of the duodenum, which was normal. Retroflexed views revealed no abnormalities.     The scope was then withdrawn from the patient and the procedure completed.  COMPLICATIONS: There were no immediate complications.  ENDOSCOPIC IMPRESSION:     1. Large antral and prepyloric channel ulcer with bleeding stigmata and adherent clot (see above) causing a functional obstruction 2. Erosive  esophagitis  RECOMMENDATIONS:     Protonix drip; Keep NPO; IVFs; Follow H/Hs; Supportive care   eSigned:  Wilford Corner, MD 03-01-2015 2:49 PM    CC:  CPT CODES: ICD CODES:  The ICD and CPT codes recommended by this software are interpretations from the data that the clinical staff has captured with the software.  The verification of the translation of this report to the ICD and CPT codes and modifiers is the sole responsibility of the health care institution and practicing physician where this report was generated.  [facility]. will not be held responsible for the validity of the ICD and CPT codes included on this report.  AMA assumes no liability for data contained or not contained herein. CPT is a Designer, television/film set of the [employer].  PATIENT NAME:  Jon Ramirez, Jon Ramirez MR#: [id number]

## 2015-02-03 NOTE — ED Notes (Signed)
Attempted report.  Was asked to call back in 25 mins due to giving report and not being able to take report.

## 2015-02-03 NOTE — ED Notes (Signed)
Spoke with Poison Control regarding pt - states that Prometherin 10% (pesticide used by pt) can cause dermatitis and burns to the skin. Pt states that he has been using the Prometherin for 2 weeks. Poison Control is calling her toxicologist and will call back.

## 2015-02-03 NOTE — Brief Op Note (Signed)
Large distal stomach ulcer with bleeding stigmata and a small adherent clot; No active bleeding seen but clot could not be dislodged. Protonix drip; NPO; Step-down unit bed; Supportive care. Check H. Pylori serology and treat if positive.

## 2015-02-03 NOTE — ED Notes (Signed)
Pt returned from GI.  Pt vitals are stable and pt is sleeping

## 2015-02-03 NOTE — ED Notes (Signed)
Pt off unit with xray 

## 2015-02-03 NOTE — H&P (Signed)
Jon Ramirez is an 69 y.o. male.  Chief Complaint: vomiting and abdominal rash   HPI  69 y/o male with a PMH significant for IBS and GERD who presented to the ED with melena for the past 7 days and multiple episodes of vomiting overnight in which he started to notice blood.  The patient stated that he has a 10 yr history of IBS and GERD that normally causes him burning pain and pressure in his chest with occasional diarrhea. He states that vomiting was the only thing in the past that helped relieve his IBS and GERD symptoms (alka seltzer or mylanta did not help), so he had been attributing his recent vomiting episodes to those issues until it became more frequent and bloody last night. The patient traveled to [place] and [place] two months ago and to [place] a week ago for vacation, during which he stated he vomited 5-6 times in 1 night. He returned home yesterday and vomited 4-5 times and stated that the last few times he noticed bright red bloody emesis. After experiencing this along with some chest tightness and pain he thought he may have made himself sick by using Permethrin 10% cream which he bought online to treat self-diagnosed scabies which appeared as an abdominal/waist-line pruritic rash about 8 weeks ago - he attributes this to a "sketchy motel" he stayed in in [place] on his way back home from the [place]. He states he tried using tea tree oil and chlorox soaps first before the permethrin cream (which he dilutes with water to 1% and sponges onto the affected area for a couple of hours), but denies any significant improvement with any of the treatments he has tried. He has been using the permethrin cream 1 time daily for 2 weeks. He describes the rash as itchy and states that it has spread to his arms, torso and groin. He states that he has a history of dry, sensitive skin, particularly in the winter months. The patient denies fever/chills/neck pain or stiffness/dizzyness/dysuria/sick  contacts/insect bites; he also denies any use of new medications/creams/detergents or NSAID use beyond a couple times a month for headaches or muscle pain. His last bowel movement was 1 week ago and was watery (he states he isn't usually constipated and attributed this to not being able to eat much or hold anything down over the last week). He states that he does not smoke or drink, has no history of heavy alcohol use, has no pets, he lives alone and is not sexually active.    Allergies: No Known Allergies   Medications:  Current facility-administered medications:  .  0.9 %  sodium chloride infusion, , Intravenous, Continuous, Jessee Avers, MD .  pantoprazole (PROTONIX) 80 mg in sodium chloride 0.9 % 250 mL (0.32 mg/mL) infusion, 8 mg/hr, Intravenous, Continuous, Wilford Corner, MD, Last Rate: 25 mL/hr at 02/03/15 1524, 8 mg/hr at 02/03/15 1524 .  [START ON 02/07/2015] pantoprazole (PROTONIX) injection 40 mg, 40 mg, Intravenous, Q12H, Wilford Corner, MD  Current outpatient prescriptions:  .  ibuprofen (ADVIL,MOTRIN) 200 MG tablet, Take 200 mg by mouth every 6 (six) hours as needed for pain (pain)., Disp: , Rfl:  .  ondansetron (ZOFRAN) 4 MG tablet, Take 1 tablet (4 mg total) by mouth every 6 (six) hours. (Patient not taking: Reported on 02/03/2015), Disp: 12 tablet, Rfl: 0 .  oxyCODONE-acetaminophen (PERCOCET/ROXICET) 5-325 MG per tablet, Take 1-2 tablets by mouth every 6 (six) hours as needed for pain. (Patient not taking: Reported on 02/03/2015),  Disp: 30 tablet, Rfl: 0   Past Medical History: History reviewed. No pertinent past medical history.   Family History: History reviewed. No pertinent family history.   History   Social History  . Marital Status: Divorced    Spouse Name: N/A  . Number of Children: N/A  . Years of Education: N/A   Occupational History  . Not on file.   Social History Main Topics  . Smoking status: Never Smoker   . Smokeless tobacco: Not on file  .  Alcohol Use: No  . Drug Use: No  . Sexual Activity: Not on file   Other Topics Concern  . Not on file   Social History Narrative    Review of Systems  Constitutional: Positive for malaise/fatigue. Negative for diaphoresis.  HENT: Negative for congestion and sore throat.   Eyes: Negative for blurred vision and photophobia.  Respiratory: Positive for cough. Negative for hemoptysis, shortness of breath and wheezing.   Cardiovascular: Positive for chest pain.  Gastrointestinal: Positive for heartburn, nausea, vomiting, diarrhea, constipation and melena. Negative for abdominal pain.  Genitourinary: Negative for dysuria, urgency, frequency and hematuria.  Musculoskeletal: Negative for myalgias and neck pain.  Skin: Positive for itching and rash.  Neurological: Negative for dizziness, tingling, focal weakness and headaches.  Psychiatric/Behavioral: Negative for substance abuse.    ED Procedures: Chest and Abdominal X-Ray, Chest CT angio with contrast, EGD. Labs: CBC, CMP, ABO/Rh, Type and Screen, D-dimer, Hemoccult, U/A, Troponin I, Lipase. Patient was started on Protonix IV (35m/hr continuous) and given 0.9% NaCl infusion (107mhr continuous) and KCl 1082mIV.    Filed Vitals:   02/03/15 1434 02/03/15 1435 02/03/15 1440 02/03/15 1528  BP:  119/51 113/54 124/69  Pulse: 78 81    Temp:      TempSrc:      Resp: '16 22 13   ' SpO2: 100% 100% 100%    Physical Exam  Constitutional: He is oriented to person, place, and time. He appears well-developed and well-nourished. No distress.  HENT:  Head: Normocephalic.  Mouth/Throat: Oropharynx is clear and moist. No oropharyngeal exudate.  Eyes: Conjunctivae and EOM are normal. Pupils are equal, round, and reactive to light. No scleral icterus.  Neck: Normal range of motion. Neck supple. No JVD present. No tracheal deviation present. No thyromegaly present.  Cardiovascular: Normal rate, regular rhythm, normal heart sounds and intact distal pulses.   PMI is not displaced.  Exam reveals no gallop and no friction rub.   No murmur heard. Respiratory: Effort normal and breath sounds normal. No stridor. No respiratory distress. He has no wheezes. He has no rhonchi. He has no rales. He exhibits no tenderness.  GI: Soft. Bowel sounds are normal. He exhibits no distension, no ascites and no mass. There is no hepatosplenomegaly. There is no tenderness. There is no rebound, no guarding and no CVA tenderness. No hernia.  Genitourinary: Guaiac positive stool.  Musculoskeletal: Normal range of motion. He exhibits no edema or tenderness.  Lymphadenopathy:    He has no cervical adenopathy.    He has no axillary adenopathy.       Right: No inguinal adenopathy present.       Left: No inguinal adenopathy present.  Neurological: He is alert and oriented to person, place, and time. No cranial nerve deficit.  Skin: Skin is warm. Rash noted. Rash is papular. He is not diaphoretic. No cyanosis. Nails show no clubbing.  Rash covering abdomen and torso, arms (particularly in the elbow area) with some papules  present in the groin area. Some ulceration and bloody crust present on some papules. No tracks noted on exam. Hands/fingers/wrists w/out rash.  Multiple seborrheic keratosis present on the back - looks benign.   Psychiatric: He has a normal mood and affect.           Lab Results:  CBC Latest Ref Rng 02/03/2015 02/03/2015  WBC 4.0 - 10.5 K/uL 9.9 12.2(H)  Hemoglobin 13.0 - 17.0 g/dL 8.4(L) 9.7(L)  Hematocrit 39.0 - 52.0 % 24.7(L) 28.8(L)  Platelets 150 - 400 K/uL 369 459(H)    BMP Latest Ref Rng 02/03/2015  Glucose 70 - 99 mg/dL 136(H)  BUN 6 - 23 mg/dL 11  Creatinine 0.50 - 1.35 mg/dL 1.16  Sodium 135 - 145 mmol/L 140  Potassium 3.5 - 5.1 mmol/L 2.5(LL)  Chloride 96 - 112 mmol/L 99  CO2 19 - 32 mmol/L 29  Calcium 8.4 - 10.5 mg/dL 8.5   CMP Latest Ref Rng 02/03/2015  Glucose 70 - 99 mg/dL 136(H)  BUN 6 - 23 mg/dL 11  Creatinine 0.50 - 1.35  mg/dL 1.16  Sodium 135 - 145 mmol/L 140  Potassium 3.5 - 5.1 mmol/L 2.5(LL)  Chloride 96 - 112 mmol/L 99  CO2 19 - 32 mmol/L 29  Calcium 8.4 - 10.5 mg/dL 8.5  Total Protein 6.0 - 8.3 g/dL 6.4  Total Bilirubin 0.3 - 1.2 mg/dL 0.9  Alkaline Phos 39 - 117 U/L 83  AST 0 - 37 U/L 21  ALT 0 - 53 U/L 15   Hepatic Function Latest Ref Rng 02/03/2015  Total Protein 6.0 - 8.3 g/dL 6.4  Albumin 3.5 - 5.2 g/dL 3.3(L)  AST 0 - 37 U/L 21  ALT 0 - 53 U/L 15  Alk Phosphatase 39 - 117 U/L 83  Total Bilirubin 0.3 - 1.2 mg/dL 0.9   Urinalysis    Component Value Date/Time   COLORURINE YELLOW 02/03/2015 0718   APPEARANCEUR HAZY* 02/03/2015 0718   LABSPEC 1.021 02/03/2015 0718   PHURINE 8.5* 02/03/2015 0718   GLUCOSEU 100* 02/03/2015 0718   HGBUR NEGATIVE 02/03/2015 0718   BILIRUBINUR NEGATIVE 02/03/2015 0718   KETONESUR 15* 02/03/2015 0718   PROTEINUR 100* 02/03/2015 0718   UROBILINOGEN 1.0 02/03/2015 0718   NITRITE NEGATIVE 02/03/2015 0718   LEUKOCYTESUR NEGATIVE 02/03/2015 0718   Lipase     Component Value Date/Time   LIPASE 26 02/03/2015 0222   Cardiac Panel (last 3 results)  Recent Labs  02/03/15 0708  TROPONINI <0.03   Lab Results  Component Value Date   DDIMER 1.35* 02/03/2015   Lab Results  Component Value Date   OCCULTBLD POSITIVE* 02/03/2015   EKG: normal EKG, normal sinus rhythm, unchanged from previous tracings.  Imaging Results:  - Chest X-ray: Lungs are clear. Heart size and pulmonary vascularity are normal. No adenopathy. No bone lesions. No edema or consolidation.   - Abd X-ray: Scattered large and small bowel gas is noted. Rim-like calcifications are noted in the right upper quadrant likely related to gallstones (cholelithiasis). No obstructive changes are seen. Mild amount of fecal material is noted within the rectum. Degenerative changes of the lumbar spine are seen with a mild scoliosis concave to the left.  CT Angio Chest with Contrast: Negative for  pulmonary emboli. Negative for aortic dissection or aneurysm. Minimal atherosclerotic disease in the aortic arch. Negative for pericardial effusion. Heart size is upper normal. Lungs are clear without infiltrate or effusion. Negative for mass or adenopathy. Calcified gallstones. No acute skeletal abnormality.  EGD: Large distal stomach ulcer with bleeding stigmata and a small adherent clot. No active bleeding seen but clot could not be dislodged.   Assessment/Plan 69 y/o male with a history of IBS and GERD who presented to the ED with a 1 day history of hematemesis and possible 7 day history of melena (patient stated this during our first interview but then stated his last bowel movement was last week and was watery during our second visit) determined to be due to a large, lower peptic ulcer with bleeding stigmata found on EGD, and 8 week history of pruritic, papular rash covering the abdomen, torso and arms.    Upper GI Bleed: Large distal stomach ulcer with bleeding stigmata and a small adherent clot were found on EGD. No active bleeding seen but the clot could not be dislodged during procedure. The patient's pertinent vitals and labs are stable with the exception of low hemoglobin (9.7 on admission to 8.4 after endoscopy) and low potassium (2.5 on admission). The patient's last colonoscopy was in 2015 and was unremarkable with the exception of internal hemorrhoids. In the initial workup of this patient, my differential diagnosis included peptic ulcer disease (possibly due to H pylori, NSAID use or bisphosphonate or potassium supplement use, alcohol abuse), Mallory-Weiss tear in the setting of esophageal varices (less likely given no significant GI history or history of alcohol abuse), esophagitis due to multiple episodes of vomiting, possible neoplasm (less likely given no history of difficulty swallowing, weight loss or abnormal labs). Infectious etiologies outside of H pylori were lower on the  differential given that his foreign travel was 2 months ago and his symptoms have persisted on and off for months with recent acute exacerbation. Also lower on my differential was colonic stricture or obstruction given history of IBS in the setting of episodic vomiting and self-reported 1 week without bowel movement (CT chest ruled this out and the patient had no complaints of abdominal pain, no tenderness to palpitation on exam and normal/active bowel sounds). The ED checked for PE given recent travel, chest pain and increased D-dimer; however, this was ruled out with chest xray and CT chest with contrast - patient's O2 sats were w/in normal range with no SOB or cyanosis.  - Protonix infusion was started in the ED - Continue on 52m q12hr IV. Convert to PO after 72 hrs. Consider adding H2 antagonist and/or sulcralfate for additional coverage/symptom relief.  - Stop NSAID use  - NPO - H. Pylori serology and treat with triple therapy if positive (PPI + Clarithromycin + Amoxicillin) for 7-14 days.  - CBC q12hr to check hemoglobin  - IVF NS 1030mhr - Telemetry  Pruritic Papular Rash: Duration, appearance and symptoms consistent with scabies. Other items on the differential include syphilitic rash (even though he reports no urinary symptoms or recent sexual activity), flea/other bug bite (less likely given no pets in the home or history), allergy/sensitivity although he reports no use of new hygiene products, autoimmune/psoriasis in the setting of IBS and GI symptoms, clotting disorder (unlikely given the appearance of the rash, the patient's increased instead of decreased platelet count, pruritic nature and absence of other systemic signs). - Start permethrin cream 5% - apply once over entire body from neck down and wash off after 8-14hrs. Repeat in 1 week.  - Consider skin scraping for microscopic evaluation to confirm diagnosis of scabies.  - Consider STD testing for syphillis if the rash doesn't respond  to permethrin cream.   Hypokalemia: 2.5 on admission.  Given one IV dose of KCl in the ED. - Give 2 additional IV doses of KCl (45mq IV q1hr).  - Repeat BMP in the morning   Nausea:  - Zofran 4 mg q6 hours PRN   History of IBS: Question validity of this diagnosis. Patient's history and description of symptoms do not really fit (no real history of diarrhea and abdominal pain)  Diet: NPO  DVT Ppx: SCDs. No anticoags given ulcer with bleeding stigmata found on exam.    CMindi Slicker3/15/2016, 4:59 PM

## 2015-02-03 NOTE — ED Notes (Signed)
Pt off unit with GI.

## 2015-02-03 NOTE — ED Notes (Signed)
PA at bedside.

## 2015-02-03 NOTE — H&P (View-Only) (Signed)
Referring Provider: Dr. Dina Rich Primary Care Physician:  No primary care provider on file. Primary Gastroenterologist:  Dr. Michail Sermon  Reason for Consultation:  GI bleed  HPI: Jon Ramirez is a 69 y.o. male presenting with melena for the past 7 days and episodes of vomiting that started this morning. Vomiting was initially clear but the last episode had red blood in it and he feels like he had about 2 cups of blood with that episode. BMs have been loose and black daily for the past 7 days. Denies abdominal pain but feels like his abdomen is bloated. Has been feeling lightheaded and weak. 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 Denies fevers, chills, chest pain. Went to [place] and [place] 2 months ago. Recently developed a full body rash that he has been self-treating. Denies alcohol. Uses Advil prn. No history of ulcers. Colonoscopy in 2015 showed internal hemorrhoids.  History reviewed. No pertinent past medical history.  History reviewed. No pertinent past surgical history.  Prior to Admission medications   Medication Sig Start Date End Date Taking? Authorizing Provider  ibuprofen (ADVIL,MOTRIN) 200 MG tablet Take 200 mg by mouth every 6 (six) hours as needed for pain (pain).   Yes Historical Provider, MD  ondansetron (ZOFRAN) 4 MG tablet Take 1 tablet (4 mg total) by mouth every 6 (six) hours. Patient not taking: Reported on 02/03/2015 08/18/13   Delos Haring, PA-C  oxyCODONE-acetaminophen (PERCOCET/ROXICET) 5-325 MG per tablet Take 1-2 tablets by mouth every 6 (six) hours as needed for pain. Patient not taking: Reported on 02/03/2015 08/18/13   Delos Haring, PA-C    Scheduled Meds:  Continuous Infusions: . sodium chloride     PRN Meds:.  Allergies as of 02/03/2015  . (No Known Allergies)    No family history on file.  History   Social History  . Marital Status: Divorced    Spouse Name: N/A  .  Number of Children: N/A  . Years of Education: N/A   Occupational History  . Not on file.   Social History Main Topics  . Smoking status: Never Smoker   . Smokeless tobacco: Not on file  . Alcohol Use: No  . Drug Use: No  . Sexual Activity: Not on file   Other Topics Concern  . Not on file   Social History Narrative    Review of Systems: All negative from a GI standpoint except as stated above in HPI.  Physical Exam: Vital signs: Filed Vitals:   02/03/15 1200  BP: 136/69  Pulse: 92  Temp: 97  Resp: 16     General:   Lethargic, Well-developed, well-nourished, pleasant and cooperative in NAD HEENT: anicteric Neck: supple, nontender Lungs:  Clear throughout to auscultation.   No wheezes, crackles, or rhonchi. No acute distress. Heart:  Regular rate and rhythm; no murmurs, clicks, rubs,  or gallops. Abdomen: soft, nontender, nondistended, +BS  Rectal:  Deferred Ext: no edema Skin: diffuse papular rash  GI:  Lab Results:  Recent Labs  02/03/15 0228  WBC 12.2*  HGB 9.7*  HCT 28.8*  PLT 459*   BMET  Recent Labs  02/03/15 0228  NA 140  K 2.5*  CL 99  CO2 29  GLUCOSE 136*  BUN 11  CREATININE 1.16  CALCIUM 8.5   LFT  Recent Labs  02/03/15 0228  PROT 6.4  ALBUMIN 3.3*  AST 21  ALT 15  ALKPHOS 83  BILITOT  0.9   PT/INR No results for input(s): LABPROT, INR in the last 72 hours.   Studies/Results: Dg Chest 2 View  02/03/2015   CLINICAL DATA:  Difficulty breathing after exposure to insecticide  EXAM: CHEST  2 VIEW  COMPARISON:  None.  FINDINGS: Lungs are clear. Heart size and pulmonary vascularity are normal. No adenopathy. No bone lesions.  IMPRESSION: No edema or consolidation.   Electronically Signed   By: Lowella Grip III M.D.   On: 02/03/2015 08:52   Dg Abd 1 View  02/03/2015   CLINICAL DATA:  Recent insect aside exposure with rash  EXAM: ABDOMEN - 1 VIEW  COMPARISON:  None.  FINDINGS: Scattered large and small bowel gas is noted.  Rim-like calcifications are noted in the right upper quadrant likely related to gallstones. No obstructive changes are seen. Mild amount of fecal material is noted within the rectum. Degenerative changes of the lumbar spine are seen with a mild scoliosis concave to the left.  IMPRESSION: Mild degenerative changes of the lumbar spine.  Cholelithiasis.   Electronically Signed   By: Inez Catalina M.D.   On: 02/03/2015 08:58   Ct Angio Chest Pe W/cm &/or Wo Cm  02/03/2015   CLINICAL DATA:  Chest pain and short of breath.  Rash 8 weeks  EXAM: CT ANGIOGRAPHY CHEST WITH CONTRAST  TECHNIQUE: Multidetector CT imaging of the chest was performed using the standard protocol during bolus administration of intravenous contrast. Multiplanar CT image reconstructions and MIPs were obtained to evaluate the vascular anatomy.  CONTRAST:  123mL OMNIPAQUE IOHEXOL 350 MG/ML SOLN  COMPARISON:  Chest x-ray 02/03/2015  FINDINGS: Negative for pulmonary emboli. Negative for aortic dissection or aneurysm. Minimal atherosclerotic disease in the aortic arch. Negative for pericardial effusion. Heart size is upper normal.  Lungs are clear without infiltrate or effusion.  Negative for mass or adenopathy.  Calcified gallstones.  No acute skeletal abnormality.  Review of the MIP images confirms the above findings.  IMPRESSION: Negative for pulmonary embolism.  No acute abnormality in the chest  Cholelithiasis.   Electronically Signed   By: Franchot Gallo M.D.   On: 02/03/2015 10:06    Impression/Plan: 69 yo with melena and hematemesis concerning for a peptic ulcer bleed vs. Mallory-Weiss tear. Hemodynamically stable. EGD today. Protonix drip. NPO. Supportive care. Management of rash defer to primary team but I do not think it is related to his GI symptoms.      [place] C.  02/03/2015, 12:14 PM

## 2015-02-04 ENCOUNTER — Encounter (HOSPITAL_COMMUNITY): Payer: Self-pay | Admitting: Gastroenterology

## 2015-02-04 DIAGNOSIS — E876 Hypokalemia: Secondary | ICD-10-CM

## 2015-02-04 DIAGNOSIS — R21 Rash and other nonspecific skin eruption: Secondary | ICD-10-CM

## 2015-02-04 DIAGNOSIS — K250 Acute gastric ulcer with hemorrhage: Secondary | ICD-10-CM

## 2015-02-04 DIAGNOSIS — R110 Nausea: Secondary | ICD-10-CM

## 2015-02-04 DIAGNOSIS — E43 Unspecified severe protein-calorie malnutrition: Secondary | ICD-10-CM

## 2015-02-04 LAB — CBC
HCT: 24.5 % — ABNORMAL LOW (ref 39.0–52.0)
HEMATOCRIT: 25 % — AB (ref 39.0–52.0)
HEMOGLOBIN: 8.3 g/dL — AB (ref 13.0–17.0)
Hemoglobin: 8.5 g/dL — ABNORMAL LOW (ref 13.0–17.0)
MCH: 29.6 pg (ref 26.0–34.0)
MCH: 29.6 pg (ref 26.0–34.0)
MCHC: 33.9 g/dL (ref 30.0–36.0)
MCHC: 34 g/dL (ref 30.0–36.0)
MCV: 87.5 fL (ref 78.0–100.0)
MCV: 87.1 fL (ref 78.0–100.0)
PLATELETS: 397 10*3/uL (ref 150–400)
PLATELETS: 413 10*3/uL — AB (ref 150–400)
RBC: 2.8 MIL/uL — AB (ref 4.22–5.81)
RBC: 2.87 MIL/uL — ABNORMAL LOW (ref 4.22–5.81)
RDW: 14.3 % (ref 11.5–15.5)
RDW: 14.2 % (ref 11.5–15.5)
WBC: 8 10*3/uL (ref 4.0–10.5)
WBC: 8.1 10*3/uL (ref 4.0–10.5)

## 2015-02-04 LAB — COMPREHENSIVE METABOLIC PANEL
ALT: 11 U/L (ref 0–53)
ANION GAP: 5 (ref 5–15)
AST: 13 U/L (ref 0–37)
Albumin: 2.3 g/dL — ABNORMAL LOW (ref 3.5–5.2)
Alkaline Phosphatase: 64 U/L (ref 39–117)
BUN: 9 mg/dL (ref 6–23)
CO2: 26 mmol/L (ref 19–32)
CREATININE: 0.97 mg/dL (ref 0.50–1.35)
Calcium: 8 mg/dL — ABNORMAL LOW (ref 8.4–10.5)
Chloride: 107 mmol/L (ref 96–112)
GFR calc non Af Amer: 84 mL/min — ABNORMAL LOW (ref >90)
Glucose, Bld: 88 mg/dL (ref 70–99)
Potassium: 3.1 mmol/L — ABNORMAL LOW (ref 3.5–5.1)
SODIUM: 138 mmol/L (ref 135–145)
TOTAL PROTEIN: 4.7 g/dL — AB (ref 6.0–8.3)
Total Bilirubin: 0.9 mg/dL (ref 0.3–1.2)

## 2015-02-04 LAB — BASIC METABOLIC PANEL
Anion gap: 9 (ref 5–15)
BUN: 9 mg/dL (ref 6–23)
CALCIUM: 8 mg/dL — AB (ref 8.4–10.5)
CO2: 22 mmol/L (ref 19–32)
Chloride: 108 mmol/L (ref 96–112)
Creatinine, Ser: 0.98 mg/dL (ref 0.50–1.35)
GFR calc Af Amer: >90 mL/min (ref >90)
GFR, EST NON AFRICAN AMERICAN: 84 mL/min — AB (ref >90)
Glucose, Bld: 89 mg/dL (ref 70–99)
Potassium: 3.1 mmol/L — ABNORMAL LOW (ref 3.5–5.1)
Sodium: 139 mmol/L (ref 135–145)

## 2015-02-04 LAB — MAGNESIUM: MAGNESIUM: 2 mg/dL (ref 1.5–2.5)

## 2015-02-04 LAB — RETICULOCYTES
RBC.: 2.8 MIL/uL — ABNORMAL LOW (ref 4.22–5.81)
RETIC COUNT ABSOLUTE: 64.4 10*3/uL (ref 19.0–186.0)
Retic Ct Pct: 2.3 % (ref 0.4–3.1)

## 2015-02-04 LAB — MRSA PCR SCREENING: MRSA by PCR: NEGATIVE

## 2015-02-04 LAB — PROTIME-INR
INR: 1.12 (ref 0.00–1.49)
Prothrombin Time: 14.5 seconds (ref 11.6–15.2)

## 2015-02-04 LAB — VITAMIN B12: Vitamin B-12: 308 pg/mL (ref 211–911)

## 2015-02-04 LAB — APTT: aPTT: 29 seconds (ref 24–37)

## 2015-02-04 MED ORDER — POTASSIUM CHLORIDE IN NACL 40-0.9 MEQ/L-% IV SOLN
INTRAVENOUS | Status: DC
  Administered 2015-02-04: 100 mL/h via INTRAVENOUS
  Filled 2015-02-04 (×5): qty 1000

## 2015-02-04 NOTE — Progress Notes (Signed)
Subjective: Jon Ramirez feels better this morning. He had some pruritis overnight, but feels better now. He is willing to try the treatment today. He has had no further vomiting and no BMs.  Interval Events:  - Patient's EGD revealed large antral and prepyloric channel ulcer along with esophagitis - Patient refused permethtrin cream overnight  Objective: Vital signs in last 24 hours: Filed Vitals:   02/04/15 0445 02/04/15 0450 02/04/15 0834 02/04/15 0836  BP:  128/78  126/74  Pulse:  81  74  Temp: 98.6 F (37 C)  99 F (37.2 C)   TempSrc: Oral  Oral   Resp:  16  14  Height:      Weight:      SpO2:  100%  100%   Weight change:   Intake/Output Summary (Last 24 hours) at 02/04/15 1055 Last data filed at 02/04/15 1000  Gross per 24 hour  Intake   1280 ml  Output   1200 ml  Net     80 ml   Physical Exam:  Appearance: in NAD, lying in bed in the dark HEENT: AT/[place], PERRL, EOMi, moist mucous membranes, normal pallor Heart: RRR, normal S1S2, no murmurs Lungs: CTAB, no wheezing Abdomen: rash (as below), BS+, soft, nontender Extremities: normal range of motion, no edema Neurologic: A&Ox3, grossly intact Skin: tan skin, erythematous macular rash on abdomen, bilateral arms, back; does not involve palms or soles. No lesions in finger webbing. Multiple large SKs on back.  Right arm manifestations of rash 3/15:  Lesion on left groin 3/15:    Lab Results: Basic Metabolic Panel:  Recent Labs Lab 02/03/15 0228 02/04/15 0815  NA 140 138  K 2.5* 3.1*  CL 99 107  CO2 29 26  GLUCOSE 136* 88  BUN 11 9  CREATININE 1.16 0.97  CALCIUM 8.5 8.0*  MG  --  2.0   Liver Function Tests:  Recent Labs Lab 02/03/15 0228 02/04/15 0815  AST 21 13  ALT 15 11  ALKPHOS 83 64  BILITOT 0.9 0.9  PROT 6.4 4.7*  ALBUMIN 3.3* 2.3*    Recent Labs Lab 02/03/15 0222  LIPASE 26   CBC:  Recent Labs Lab 02/03/15 1820 02/04/15 0815  WBC 9.9 8.0  HGB 8.4* 8.3*  HCT 24.7* 24.5*    MCV 87.6 87.5  PLT 369 397   Cardiac Enzymes:  Recent Labs Lab 02/03/15 0708  TROPONINI <0.03   D-Dimer:  Recent Labs Lab 02/03/15 0803  DDIMER 1.35*   Coagulation:  Recent Labs Lab 02/04/15 0815  LABPROT 14.5  INR 1.12   Anemia Panel:  Recent Labs Lab 02/04/15 0815  RETICCTPCT 2.3   Urinalysis:  Recent Labs Lab 02/03/15 0718  COLORURINE YELLOW  LABSPEC 1.021  PHURINE 8.5*  GLUCOSEU 100*  HGBUR NEGATIVE  BILIRUBINUR NEGATIVE  KETONESUR 15*  PROTEINUR 100*  UROBILINOGEN 1.0  NITRITE NEGATIVE  LEUKOCYTESUR NEGATIVE    Micro Results: Recent Results (from the past 240 hour(s))  MRSA PCR Screening     Status: None   Collection Time: 02/04/15  5:21 AM  Result Value Ref Range Status   MRSA by PCR NEGATIVE NEGATIVE Final    Comment:        The GeneXpert MRSA Assay (FDA approved for NASAL specimens only), is one component of a comprehensive MRSA colonization surveillance program. It is not intended to diagnose MRSA infection nor to guide or monitor treatment for MRSA infections.    Studies/Results: Dg Chest 2 View  02/03/2015  CLINICAL DATA:  Difficulty breathing after exposure to insecticide  EXAM: CHEST  2 VIEW  COMPARISON:  None.  FINDINGS: Lungs are clear. Heart size and pulmonary vascularity are normal. No adenopathy. No bone lesions.  IMPRESSION: No edema or consolidation.   Electronically Signed   By: Lowella Grip III M.D.   On: 02/03/2015 08:52   Dg Abd 1 View  02/03/2015   CLINICAL DATA:  Recent insect aside exposure with rash  EXAM: ABDOMEN - 1 VIEW  COMPARISON:  None.  FINDINGS: Scattered large and small bowel gas is noted. Rim-like calcifications are noted in the right upper quadrant likely related to gallstones. No obstructive changes are seen. Mild amount of fecal material is noted within the rectum. Degenerative changes of the lumbar spine are seen with a mild scoliosis concave to the left.  IMPRESSION: Mild degenerative changes  of the lumbar spine.  Cholelithiasis.   Electronically Signed   By: Inez Catalina M.D.   On: 02/03/2015 08:58   Ct Angio Chest Pe W/cm &/or Wo Cm  02/03/2015   CLINICAL DATA:  Chest pain and short of breath.  Rash 8 weeks  EXAM: CT ANGIOGRAPHY CHEST WITH CONTRAST  TECHNIQUE: Multidetector CT imaging of the chest was performed using the standard protocol during bolus administration of intravenous contrast. Multiplanar CT image reconstructions and MIPs were obtained to evaluate the vascular anatomy.  CONTRAST:  163mL OMNIPAQUE IOHEXOL 350 MG/ML SOLN  COMPARISON:  Chest x-ray 02/03/2015  FINDINGS: Negative for pulmonary emboli. Negative for aortic dissection or aneurysm. Minimal atherosclerotic disease in the aortic arch. Negative for pericardial effusion. Heart size is upper normal.  Lungs are clear without infiltrate or effusion.  Negative for mass or adenopathy.  Calcified gallstones.  No acute skeletal abnormality.  Review of the MIP images confirms the above findings.  IMPRESSION: Negative for pulmonary embolism.  No acute abnormality in the chest  Cholelithiasis.   Electronically Signed   By: Franchot Gallo M.D.   On: 02/03/2015 10:06   Medications: I have reviewed the patient's current medications. Scheduled Meds: . [START ON 02/07/2015] pantoprazole (PROTONIX) IV  40 mg Intravenous Q12H   Continuous Infusions: . 0.9 % NaCl with KCl 40 mEq / L 100 mL/hr (02/04/15 0842)  . pantoprozole (PROTONIX) infusion 8 mg/hr (02/04/15 0700)   PRN Meds:.ondansetron Assessment/Plan: Principal Problem:   Upper GI bleed Active Problems:   Melena   Hematemesis   Rash and nonspecific skin eruption   Hypokalemia  Jon Ramirez is a 69 yo man with IBS who presented with a rash and then reported melena for the past 7 days and nausea/vomiting this morning. Though initially clear, his vomitus became bloody. He was taken for EGD and found to have a large antral and prepyloric ulcer with bleeding  stigmata.  Upper GI Bleed: Peptic ulcer bleed or Krystan [employer] tear were considered on this patient's presentation as causes of his hematemesis and melena. Though he has had no further hematemesis or melena, on EGD, he was found to have a large distal stomach ulcer with bleeding stigmata and small adherent clot. It was not actively bleeding and clot could not be dislodged. Hemoglobin 9.7-->8.4-->8.3. Colonoscopy in 2015 was only significant for internal hemorrhoids. - Appreciate GI consult and EGD - Admit to SDU - Protonix infusion (40 mg BID) and bolus; will need likely need maintenance antisecretory therapy as outpatient (patient appears to have had multiple occurrences per year)  - Stop NSAID use - Heliobacter pylori abs-IgG+IgA test; if positive,  can treat with triple therapy (PPI, amoxicillin and clarithromycin) pending - CBC q12 to trend hemoglobin; will transfuse if <7 - IVF NS 100 mL/hr while NPO - Telemetry  Erythematous Papular Rash: Appears to be consistent with scabies, and per patient report, has responded somewhat to permethrin solution. Could also consider syphilitic rash if he does not respond to treatment. No contacts at home.  - Apply permethrin cream 5% to entire body below neck once and wash off in 8-14 hours; then repeat in 1 week  Hypokalemia: 2.5 on admission-->3.1. Given three runs of potassium chloride overnight.  - Trend BMET (8PM) - KCl now in NS (40 meq / L)  Nausea: Present this morning. - Zofran 4 mg q6 hours PRN (this is a home medication)  History of IBS: Patient's description of his symptoms do not fit an IBS profile (he has never had diarrhea, described chest burning and occasional emesis). Question validity of this diagnosis.   Diet: Start liquids, ADAT starting tomorrow  DVT Ppx: SCDs  Dispo: Disposition is deferred at this time, awaiting improvement of current medical problems.  Anticipated discharge in approximately 1 day(s).   The patient does  not have a current PCP (No primary care provider on file.) and does not need an [facility] hospital follow-up appointment after discharge.  The patient does not have transportation limitations that hinder transportation to clinic appointments.  .Services Needed at time of discharge: Y = Yes, Blank = No PT:   OT:   RN:   Equipment:   Other:     LOS: 1 day   Karlene Einstein, MD 02/04/2015, 10:55 AM

## 2015-02-04 NOTE — Progress Notes (Signed)
INITIAL NUTRITION ASSESSMENT  DOCUMENTATION CODES Per approved criteria  -Severe malnutrition in the context of acute illness or injury   INTERVENTION:  Diet advancement per MD as able.  Recommend regular diet with Ensure Enlive po BID, each supplement provides 350 kcal and 20 grams of protein  NUTRITION DIAGNOSIS: Malnutrition related to inadequate oral intake as evidenced by intake </= 50 % of estimated energy requirement for >/= 5 days with 5% weight loss within one week.   Goal: Intake to meet >90% of estimated nutrition needs.  Monitor:  Diet advancement, PO intake, labs, weight trend.  Reason for Assessment: Malnutrition Screening Tool  69 y.o. male  Admitting Dx: Upper GI bleed  ASSESSMENT: Patient presented to the ED on 3/15 with melena for 7 days and vomiting blood. Hx of IBS and GERD. Recent travel to [place] and [place] two months ago and [place] one week ago.  Unable to complete nutrition focused physical exam at this time. Patient reports that he weighed 185 lbs a few weeks ago. Intake very poor for the past 7 days due to vomiting. Patient remains NPO at this time. He developed an abdominal rash 8 weeks ago that he has been self treating as scabies. Rash has spread to his arms, torso, and groin.  Height: Ht Readings from Last 1 Encounters:  02/03/15 6' (1.829 m)    Weight: Wt Readings from Last 1 Encounters:  02/03/15 176 lb 12.9 oz (80.2 kg)    Ideal Body Weight: 80.9 kg  % Ideal Body Weight: 99%  Wt Readings from Last 10 Encounters:  02/03/15 176 lb 12.9 oz (80.2 kg)    Usual Body Weight: 185 lbs one week ago  % Usual Body Weight: 95%  BMI:  Body mass index is 23.97 kg/(m^2). WNL  Estimated Nutritional Needs: Kcal: 2050-2250 Protein: 100-115 gm Fluid: 2.2 L  Skin: skin rash from groin up; ? Scabies per RN  Diet Order:  NPO  EDUCATION NEEDS: -Education not appropriate at this time   Intake/Output Summary (Last 24 hours) at 02/04/15  1114 Last data filed at 02/04/15 1000  Gross per 24 hour  Intake   1280 ml  Output   1200 ml  Net     80 ml    Last BM: 3/15   Labs:   Recent Labs Lab 02/03/15 0228 02/04/15 0815  NA 140 138  K 2.5* 3.1*  CL 99 107  CO2 29 26  BUN 11 9  CREATININE 1.16 0.97  CALCIUM 8.5 8.0*  MG  --  2.0  GLUCOSE 136* 88    CBG (last 3)  No results for input(s): GLUCAP in the last 72 hours.  Scheduled Meds: . [START ON 02/07/2015] pantoprazole (PROTONIX) IV  40 mg Intravenous Q12H    Continuous Infusions: . 0.9 % NaCl with KCl 40 mEq / L 100 mL/hr (02/04/15 0842)  . pantoprozole (PROTONIX) infusion 8 mg/hr (02/04/15 0700)    History reviewed. No pertinent past medical history.  Past Surgical History  Procedure Laterality Date  . Esophagogastroduodenoscopy N/A 02/03/2015    Procedure: ESOPHAGOGASTRODUODENOSCOPY (EGD);  Surgeon: Wilford Corner, MD;  Location: [facility] ENDOSCOPY;  Service: Endoscopy;  Laterality: N/A;    Molli Barrows, RD, LDN, [place] Pager [phone] After Hours Pager [phone]

## 2015-02-04 NOTE — Progress Notes (Signed)
Principal Problem:   Upper GI bleed Active Problems:   Melena   Hematemesis   GI bleed   Rash and nonspecific skin eruption   Hypokalemia      Code Status Orders        Start     Ordered   02/03/15 2055  Full code   Continuous     02/03/15 2054      Length of Stay (days):1   SUBJECTIVE/24 HOUR EVENTS: 69 y.o. male with PMH significant for IBS and GERD who presented to the ED yesterday with 1 day history of hematemesis and 1 week of melena determined to be secondary to erosive esophagitis and a large antral and prepyloric channel ulcer with bleeding stigmata and adherent clot found on EGD. Patient denied excessive NSAID use, stating that he only used them a couple times a month for intermittent headaches or pain, and denied alcohol/tobacco/illicit drug use. The patient also presented with a  2 month history of abdominal/chest/inguinal pruritic rash that he self-diagnosed as scabies after staying in a cheap motel in [place] that has not responded to his attempts with Permethrin cream he bought online. We agreed that the rash was consistent w/ scabies given the appearance, duration and history and ordered 5% Permethrin cream to be applied last night by the nursing staff: however, patient refused treatment last night over concerns for safety and cost of the cream. We consulted with him about this this morning and he agreed to treatment. Patient stated that rash is still very itchy and seems to be worse at night. Patient is otherwise without complaints - he denies chest pain/SOB/abdominal pain/nausea/vomiting/diarrhea/fever/chills.   OBJECTIVE: Filed Vitals:   02/04/15 0445 02/04/15 0450 02/04/15 0834 02/04/15 0836  BP:  128/78  126/74  Pulse:  81  74  Temp: 98.6 F (37 C)  99 F (37.2 C)   TempSrc: Oral  Oral   Resp:  16  14  Height:      Weight:      SpO2:  100%  100%    Intake/Output Summary (Last 24 hours) at 02/04/15 1042 Last data filed at 02/04/15 0842  Gross per 24  hour  Intake   1050 ml  Output    750 ml  Net    300 ml    Intake/Output last 3 shifts: I/O last 3 completed shifts: In: 880 [I.V.:880] Out: 1000 [Urine:1000]  No Known Allergies  Medications: Scheduled Meds: . [START ON 02/07/2015] pantoprazole (PROTONIX) IV  40 mg Intravenous Q12H   Continuous Infusions: . 0.9 % NaCl with KCl 40 mEq / L 100 mL/hr (02/04/15 0842)  . pantoprozole (PROTONIX) infusion 8 mg/hr (02/04/15 0210)   PRN Meds:.ondansetron  Physical Exam: GEN: NAD, AAOx4, WDWN HEENT: MMM, EOMI, PERRLA, b/l sclera anicteric, no conjunctival injection or pallor, no lymphadenopathy  NECK: Supple, no JVD CV: RRR, S1S2nl, no murmurs/rubs/gallops, PMI nondisplaced PULM: clear to auscultation b/l, no rales/rhonchi/wheezes ABD: normal/active bowel sounds, soft, ND/NT, no rebound, no guarding, no hepatosplenomegaly. Papular pruritic rash  EXT: no cyanosis, clubbing or edema  GU: rash consistent with abdominal rash in inguinal area. No lymphadenopathy.  NEURO: no focal deficits  PSYCH: nl affect, nl speech Skin: Pruritic, papular rash on the abdomen, chest, inguinal area and skin folds of the elbow with some ulceration/bloody crusts. Multiple seborrheic keratosis on the back bilaterally.    Labs: Recent Labs     02/03/15  0228  02/03/15  1820  02/04/15  0815  HGB  9.7*  8.4*  8.3*  HCT  28.8*  24.7*  24.5*  PLT  459*  369  397  NA  140   --   138  K  2.5*   --   3.1*  CL  99   --   107  CO2  29   --   26  BUN  11   --   9  CREATININE  1.16   --   0.97  CALCIUM  8.5   --   8.0*    CBC Latest Ref Rng 02/04/2015 02/03/2015 02/03/2015  WBC 4.0 - 10.5 K/uL 8.0 9.9 12.2(H)  Hemoglobin 13.0 - 17.0 g/dL 8.3(L) 8.4(L) 9.7(L)  Hematocrit 39.0 - 52.0 % 24.5(L) 24.7(L) 28.8(L)  Platelets 150 - 400 K/uL 397 369 459(H)  Retic Ct Pct: 2.3% (8:15am 02/04/2015) Retic Ct Manual: 64.4 MiL/uL (8:15am 02/04/2015)  BMP Latest Ref Rng 02/04/2015 02/03/2015  Glucose 70 - 99 mg/dL 88  136(H)  BUN 6 - 23 mg/dL 9 11  Creatinine 0.50 - 1.35 mg/dL 0.97 1.16  Sodium 135 - 145 mmol/L 138 140  Potassium 3.5 - 5.1 mmol/L 3.1(L) 2.5(LL)  Chloride 96 - 112 mmol/L 107 99  CO2 19 - 32 mmol/L 26 29  Calcium 8.4 - 10.5 mg/dL 8.0(L) 8.5    CMP Latest Ref Rng 02/04/2015 02/03/2015  Glucose 70 - 99 mg/dL 88 136(H)  BUN 6 - 23 mg/dL 9 11  Creatinine 0.50 - 1.35 mg/dL 0.97 1.16  Sodium 135 - 145 mmol/L 138 140  Potassium 3.5 - 5.1 mmol/L 3.1(L) 2.5(LL)  Chloride 96 - 112 mmol/L 107 99  CO2 19 - 32 mmol/L 26 29  Calcium 8.4 - 10.5 mg/dL 8.0(L) 8.5  Total Protein 6.0 - 8.3 g/dL 4.7(L) 6.4  Total Bilirubin 0.3 - 1.2 mg/dL 0.9 0.9  Alkaline Phos 39 - 117 U/L 64 83  AST 0 - 37 U/L 13 21  ALT 0 - 53 U/L 11 15   Lab Results  Component Value Date   INR 1.12 02/04/2015   APTT: 29 seconds (8:15am 02/04/2015)  Magnesium: 2.0 mg/dL (8:15am 02/04/2015)  MRSA PCR Screening: Negative   Images: No new imaging since yesterday. See previous H&P.   Medications, Vitals, Labs, and Images reviewed.   ASSESSMENT AND PLAN: 69 y/o male with a history of IBS and GERD who presented to the ED yesterday with a 1 day history of hematemesis and 7 day history of melena determined to be due to a large, lower peptic ulcer with bleeding stigmata found on EGD, and 8 week history of pruritic, papular rash covering the abdomen, torso and arms most consistent with scabies.   Upper GI Bleed: Large antral and prepyloric channel ulcer with bleeding stigmata and a small adherent clot were found on EGD. No active bleeding seen but the clot could not be dislodged during procedure. Erosive esophagitis also noted on EGD. The patient's pertinent vitals and labs are stable with the exception of low hemoglobin (9.7 on admission to 8.3 today) and low potassium (2.5 on admission but trending up after 3 rounds of 10 mEq of KCl - now 3.1).  Peptic ulcer disease (possibly due to H pylori or NSAID use).    - Continue Protonix  infusion on 40mg  q12hr IV. Consider adding H2 antagonist and/or sulcralfate for additional coverage/symptom relief. Will need maintenance therapy as outpatient.  - Stop NSAID use  - Diet changed from NPO to clear liquids  - H. Pylori serology still pending. Treat with triple therapy  if positive (PPI + Clarithromycin + Amoxicillin) for 7-14 days.  - CBC q12hr to check hemoglobin - anemia studies sent in case of need for transfusion if hemoglobin falls below 7.  - IVF NS 123ml/hr - Telemetry  Pruritic Papular Rash: Duration, appearance and symptoms consistent with scabies. - Start permethrin cream 5% - apply once over entire body from neck down and wash off after 8-14hrs. Repeat in 1 week.  - Consider skin scraping for microscopic evaluation to confirm diagnosis of scabies.  - Consider STD testing for syphillis if the rash doesn't respond to permethrin cream.   Hypokalemia: 2.5 on admission. Given three IV doses of KCl (80mEq IV q1hr). Now 3.1. Magnesium level normal at 2.0 mg/dL.  - Give 50mEq/L in NS over 10hr.  - Repeat BMP 8:00am tomorrow to monitor for goal of 3.5 - 5.1  Nausea:  - Zofran 4 mg q6 hours PRN   History of IBS: Question validity of this diagnosis. Patient's history and description of symptoms do not really fit (no real history of diarrhea and abdominal pain)  Diet: Start liquids, ADAT starting tomorrow if tolerated. Possible discharge tomorrow with outpatient f/u if no continued vomiting with diet changes or bleeding disruptions.   DVT Ppx: SCDs. No anticoags given ulcer with bleeding stigmata found on exam.

## 2015-02-04 NOTE — Progress Notes (Signed)
Attempted to call report, RN unable at this time. Will return call.

## 2015-02-04 NOTE — Progress Notes (Signed)
New Admission Note:   Arrival Method: bed  Mental Orientation: alert and oriented  Telemetry: box 9 Skin: Rash on skin rule out scabies  Safety Measures: Safety Fall Prevention Plan has been given, discussed and signed Admission: Completed [address] [place] Orientation: Patient has been orientated to the room, unit and staff.  Family: NO visitors at bedside  Orders have been reviewed and implemented. Will continue to monitor the patient. Call light has been placed within reach and bed alarm has been activated.   Alcide Evener BSN, RN Phone number: [phone]

## 2015-02-04 NOTE — Care Management Note (Unsigned)
    Page 1 of 1   02/04/2015     2:59:08 PM CARE MANAGEMENT NOTE 02/04/2015  Patient:  Jon Ramirez, Jon Ramirez   Account Number:  [id number]  Date Initiated:  02/04/2015  Documentation initiated by:  Whitman Hero  Subjective/Objective Assessment:   PTA from home admitted with GI bleed.     Action/Plan:   Return to home when medically stable. CM to f/u with d/c needs.   Anticipated DC Date:  02/05/2015   Anticipated DC Plan:  [place]  CM consult      Choice offered to / List presented to:             Status of service:  In process, will continue to follow Medicare Important Message given?  NA - LOS <3 / Initial given by admissions (If response is "NO", the following Medicare IM given date fields will be blank) Date Medicare IM given:   Medicare IM given by:   Date Additional Medicare IM given:   Additional Medicare IM given by:    Discharge Disposition:    Per UR Regulation:  Reviewed for med. necessity/level of care/duration of stay  If discussed at [place] of Stay Meetings, dates discussed:    Comments:

## 2015-02-04 NOTE — Progress Notes (Signed)
Patient ID: Jon Ramirez, male   DOB: 1946-07-24, 69 y.o.   MRN: [id number] [name] Gastroenterology Progress Note  Jon Ramirez 69 y.o. 12-Aug-1946   Subjective: Feels good. No BMs overnight. Denies abdominal pain, nausea, and vomiting.  Objective: Vital signs in last 24 hours: Filed Vitals:   02/04/15 0836  BP: 126/74  Pulse: 74  Temp: 99  Resp: 14    Physical Exam: Gen: alert, no acute distress Abd: soft, nontender, nondistended Lab Results:  Recent Labs  02/03/15 0228 02/04/15 0815  NA 140 138  K 2.5* 3.1*  CL 99 107  CO2 29 26  GLUCOSE 136* 88  BUN 11 9  CREATININE 1.16 0.97  CALCIUM 8.5 8.0*  MG  --  2.0    Recent Labs  02/03/15 0228 02/04/15 0815  AST 21 13  ALT 15 11  ALKPHOS 83 64  BILITOT 0.9 0.9  PROT 6.4 4.7*  ALBUMIN 3.3* 2.3*    Recent Labs  02/03/15 1820 02/04/15 0815  WBC 9.9 8.0  HGB 8.4* 8.3*  HCT 24.7* 24.5*  MCV 87.6 87.5  PLT 369 397    Recent Labs  02/04/15 0815  LABPROT 14.5  INR 1.12      Assessment/Plan: S/P Gastric Ulcer bleed - no signs of ongoing bleeding. Hgb stable. Start clear liquids and if ok then slowly advance further starting tomorrow. Continue Protonix drip. F/U on H. Pylori serology.   [place] C. 02/04/2015, 11:20 AM

## 2015-02-05 ENCOUNTER — Other Ambulatory Visit: Payer: Self-pay | Admitting: Internal Medicine

## 2015-02-05 DIAGNOSIS — K270 Acute peptic ulcer, site unspecified, with hemorrhage: Secondary | ICD-10-CM

## 2015-02-05 LAB — CBC
HCT: 26.5 % — ABNORMAL LOW (ref 39.0–52.0)
Hemoglobin: 9 g/dL — ABNORMAL LOW (ref 13.0–17.0)
MCH: 29.2 pg (ref 26.0–34.0)
MCHC: 34 g/dL (ref 30.0–36.0)
MCV: 86 fL (ref 78.0–100.0)
PLATELETS: 428 10*3/uL — AB (ref 150–400)
RBC: 3.08 MIL/uL — AB (ref 4.22–5.81)
RDW: 14.2 % (ref 11.5–15.5)
WBC: 6.1 10*3/uL (ref 4.0–10.5)

## 2015-02-05 LAB — HELICOBACTER PYLORI ABS-IGG+IGA, BLD: H Pylori IgA: <9 units (ref 0.0–8.9)

## 2015-02-05 MED ORDER — OMEPRAZOLE 40 MG PO CPDR: 40.0000 mg | DELAYED_RELEASE_CAPSULE | Freq: Two times a day (BID) | ORAL | Status: DC

## 2015-02-05 MED ORDER — PANTOPRAZOLE SODIUM 40 MG PO TBEC: 40.0000 mg | DELAYED_RELEASE_TABLET | Freq: Two times a day (BID) | ORAL | Status: DC

## 2015-02-05 MED ORDER — PERMETHRIN 5 % EX CREA: 1.0000 "application " | TOPICAL_CREAM | Freq: Once | CUTANEOUS | Status: DC

## 2015-02-05 MED ORDER — POTASSIUM CHLORIDE CRYS ER 20 MEQ PO TBCR
40.0000 meq | EXTENDED_RELEASE_TABLET | ORAL | Status: AC
  Administered 2015-02-05 (×2): 40 meq via ORAL
  Filled 2015-02-05 (×2): qty 2

## 2015-02-05 NOTE — Progress Notes (Signed)
Principal Problem:   Upper GI bleed Active Problems:   Melena   Hematemesis   Rash and nonspecific skin eruption   Hypokalemia   Protein-calorie malnutrition, severe      Code Status Orders        Start     Ordered   02/03/15 2055  Full code   Continuous     02/03/15 2054      Length of Stay (days):2   SUBJECTIVE/24 HOUR EVENTS: 69 y.o. male found to have erosive esophagitis and a large antral and prepyloric channel ulcer with bleeding stigmata and adherent clot found on EGD. Patient was treated with 5% Permethrin cream yesterday for probable scabies and states that he has noticed a reduction in itching and redness. Patient requested that IV Protonix and saline be removed at 3:00am due to irritation from the noise from the machines. He had also originally wanted to leave the hospital today AMA; however, after talking with the patient he agreed that he should stay to receive the recommended IV infusions and monitoring. F/u appointments with his PCP and with Dr. Michail Sermon in GI have been scheduled. He did well on a liquid diet with no vomiting or chest pain - agreed to trying out more solid foods today. Patient is otherwise without complaints - he denies chest pain/SOB/abdominal pain/nausea/vomiting/diarrhea/fever/chills.  OBJECTIVE: Filed Vitals:   02/04/15 1619 02/04/15 2125 02/05/15 0550 02/05/15 1000  BP: 136/78 137/85 140/69 132/70  Pulse: 73 99 89 92  Temp:  99 F (37.2 C) 98.6 F (37 C) 98.7 F (37.1 C)  TempSrc:  Oral Oral Oral  Resp: 18 18 17 17   Height:  6' (1.829 m)    Weight:  80.5 kg (177 lb 7.5 oz)    SpO2: 100% 100% 100% 100%    Intake/Output Summary (Last 24 hours) at 02/05/15 1423 Last data filed at 02/05/15 0920  Gross per 24 hour  Intake   1095 ml  Output    200 ml  Net    895 ml    Intake/Output last 3 shifts: I/O last 3 completed shifts: In: 8588 [P.O.:1580; I.V.:2155] Out: 1400 [Urine:1400]  No Known Allergies  Medications: Scheduled  Meds: . [START ON 02/07/2015] pantoprazole (PROTONIX) IV  40 mg Intravenous Q12H   Continuous Infusions: . 0.9 % NaCl with KCl 40 mEq / L 75 mL/hr (02/05/15 1314)  . pantoprozole (PROTONIX) infusion 8 mg/hr (02/05/15 1314)   PRN Meds:.ondansetron  Physical Exam: GEN: NAD, AAOx4, WDWN HEENT: MMM, EOMI, PERRLA, b/l sclera anicteric, no conjunctival injection or pallor, no lymphadenopathy  CV: RRR, S1S2nl, no murmurs/rubs/gallops, PMI nondisplaced PULM: clear to auscultation b/l, no rales/rhonchi/wheezes ABD: normal/active bowel sounds, soft, ND/NT, no rebound, no guarding, no hepatosplenomegaly. Papular pruritic rash - improved.  EXT: no cyanosis, clubbing or edema  GU: rash consistent with abdominal rash in inguinal area. No lymphadenopathy.  NEURO: no focal deficits  PSYCH: nl affect, nl speech Skin: Pruritic, papular rash on the abdomen, chest, inguinal area and skin folds of the elbow - improving. Multiple seborrheic keratosis on the back bilaterally.    Labs: Recent Labs     02/04/15  0815  02/04/15  2045  02/05/15  1330  HGB  8.3*  8.5*  9.0*  HCT  24.5*  25.0*  26.5*  PLT  397  413*  428*  NA  138  139   --   K  3.1*  3.1*   --   CL  107  108   --  CO2  26  22   --   BUN  9  9   --   CREATININE  0.97  0.98   --   CALCIUM  8.0*  8.0*   --     CBC Latest Ref Rng 02/05/2015 02/04/2015 02/04/2015  WBC 4.0 - 10.5 K/uL 6.1 8.1 8.0  Hemoglobin 13.0 - 17.0 g/dL 9.0(L) 8.5(L) 8.3(L)  Hematocrit 39.0 - 52.0 % 26.5(L) 25.0(L) 24.5(L)  Platelets 150 - 400 K/uL 428(H) 413(H) 397    BMP Latest Ref Rng 02/04/2015 02/04/2015 02/03/2015  Glucose 70 - 99 mg/dL 89 88 136(H)  BUN 6 - 23 mg/dL 9 9 11   Creatinine 0.50 - 1.35 mg/dL 0.98 0.97 1.16  Sodium 135 - 145 mmol/L 139 138 140  Potassium 3.5 - 5.1 mmol/L 3.1(L) 3.1(L) 2.5(LL)  Chloride 96 - 112 mmol/L 108 107 99  CO2 19 - 32 mmol/L 22 26 29   Calcium 8.4 - 10.5 mg/dL 8.0(L) 8.0(L) 8.5    CMP Latest Ref Rng 02/04/2015 02/04/2015  02/03/2015  Glucose 70 - 99 mg/dL 89 88 136(H)  BUN 6 - 23 mg/dL 9 9 11   Creatinine 0.50 - 1.35 mg/dL 0.98 0.97 1.16  Sodium 135 - 145 mmol/L 139 138 140  Potassium 3.5 - 5.1 mmol/L 3.1(L) 3.1(L) 2.5(LL)  Chloride 96 - 112 mmol/L 108 107 99  CO2 19 - 32 mmol/L 22 26 29   Calcium 8.4 - 10.5 mg/dL 8.0(L) 8.0(L) 8.5  Total Protein 6.0 - 8.3 g/dL - 4.7(L) 6.4  Total Bilirubin 0.3 - 1.2 mg/dL - 0.9 0.9  Alkaline Phos 39 - 117 U/L - 64 83  AST 0 - 37 U/L - 13 21  ALT 0 - 53 U/L - 11 15   APTT: 29 seconds (8:15am 02/04/2015)  Magnesium: 2.0 mg/dL (8:15am 02/04/2015)  MRSA PCR Screening: Negative   Images: No new imaging since admission. See previous notes.   Medications, Vitals, Labs, and Images reviewed.   ASSESSMENT AND PLAN: 69 y/o male with a history of IBS and GERD who presented to the ED 2 days ago with a 1 day history of hematemesis and 7 day history of melena determined to be due to a large, lower peptic ulcer with bleeding stigmata found on EGD, and 8 week history of pruritic, papular rash covering the abdomen, torso and arms most consistent with scabies.   Upper GI Bleed: Large antral and prepyloric channel ulcer with bleeding stigmata and a small adherent clot. No active bleeding seen but the clot could not be dislodged during procedure. Erosive esophagitis also noted on EGD. The patient's pertinent vitals and labs are stable with the exception of low hemoglobin which has improved from yesterday (8.5 to 9.0 this afternoon) and low potassium (2.5 on admission but trending up after 3 rounds of 10 mEq of KCl and 70mEq over 10hr in NS IV  - now 3.1). Peptic ulcer disease (possibly due to H pylori or NSAID use).  - Continue Protonix infusion on 40mg  q12hr IV. Consider adding H2 antagonist and/or sulcralfate for additional coverage/symptom relief. Will need maintenance therapy as outpatient (40mg  BID omeprazole).  - Stop NSAID use  - Diet - add solid foods to liquid diet as  tolerated.  - H. Pylori serology still pending. Treat with triple therapy if positive (PPI + Clarithromycin + Amoxicillin) for 7-14 days.  - CBC q12hr to check hemoglobin - anemia studies sent in case of need for transfusion if hemoglobin falls below 7.  - IVF NS 178ml/hr -  Telemetry  Pruritic Papular Rash: Duration, appearance and symptoms consistent with scabies. - permethrin cream 5% applied yesterday  - improvement noted. Will reapply in 1 week over entire body from neck down and wash off after 8-14hrs.  - Consider skin scraping for microscopic evaluation to confirm diagnosis of scabies if itching/papules worsen.   - Consider STD testing for syphillis if the rash doesn't continue to respond to permethrin cream.   Hypokalemia: 2.5 on admission. Given three IV doses of KCl (59mEq IV q1hr) and 64mEq/L in NS over 10hr - still at 3.1. Magnesium level normal at 2.0 mg/dL.  - Add another 34mEq/L in NS over 10hr. Repeat BMP 8:00am tomorrow to monitor for goal of 3.5 - 5.1  Nausea:  - Zofran 4 mg q6 hours PRN   History of IBS: Question validity of this diagnosis. Patient's history and description of symptoms do not really fit (no real history of diarrhea and abdominal pain)  Diet: ADAT   DVT Ppx: SCDs. No anticoags given ulcer with bleeding stigmata found on exam.

## 2015-02-05 NOTE — Progress Notes (Addendum)
Called in to speak with this patient. Patient upset stating I want to be discharged right this minute. Attempted to discuss patients concerns. Patient upset d/t his diet being changed and not yet receiving the updated meal tray. Patient noted to have 2 clear liquid trays on the counter in his room. Patient stated that he did not want the clear liquid tray. Patient stated that someone had called his room regarding  His updated meal tray and stated that the  Meal would take a while to come. Attempted to offer patient a sandwich or other foods until tray arrived. Patient declined and continued to state he was leaving. Explained to patient the importance of staying in the hospital until medically stable to be discharged by the MD. Patient insistent on leaving AMA.  Patient removed telemetry box. IV discontinued by the RN.   Patient signed AMA form.  Paged and awaiting call back from Dr. Sherrine Maples to inform of the above.  1502 received call back from Dr. Denton Brick informed of the above. Stated that patient was not being discharged and would have to leave AMA. Stated to inform patient that there would be a prescription called in for Protonix to the [facility] that he uses. And that he is strongly encouraged to use the protonix to protect from the stomach acid.   @1505  attempted to go and give patient the message about the protonix however he had already been wheeled down off the floor at his request.   Donnice Nielsen, Bryn Gulling

## 2015-02-05 NOTE — Progress Notes (Signed)
Called and left message on pt's voicemail about Protonix script being sent to pt's [facility] pharmacy.   Gavin Potters

## 2015-02-05 NOTE — Progress Notes (Addendum)
Subjective: Jon Ramirez feels fine and has had no further vomiting. No BMs. Eating clear liquids has been going well. Patient would like to leave the hospital today.  Interval Events:  - Patient refused IV protonix overnight (stopped at 3 am) - After talks with GI and IM doctors, now amenable to continuing with IV treatment as long as he can also have some food  Objective: Vital signs in last 24 hours: Filed Vitals:   02/04/15 1531 02/04/15 1619 02/04/15 2125 02/05/15 0550  BP:  136/78 137/85 140/69  Pulse:  73 99 89  Temp: 98.4 F (36.9 C)  99 F (37.2 C) 98.6 F (37 C)  TempSrc: Oral  Oral Oral  Resp:  18 18 17   Height:   6' (1.829 m)   Weight:   177 lb 7.5 oz (80.5 kg)   SpO2:  100% 100% 100%   Weight change: 10.6 oz (0.3 kg)  Intake/Output Summary (Last 24 hours) at 02/05/15 0725 Last data filed at 02/04/15 1700  Gross per 24 hour  Intake   2830 ml  Output    650 ml  Net   2180 ml   Physical Exam:  Appearance: in NAD, lying in bed in the dark, clear breakfast tray at bedside HEENT: AT/[place], PERRL, EOMi, moist mucous membranes, normal pallor Heart: RRR, normal S1S2, no murmurs Lungs: CTAB, no wheezing Abdomen: rash (as below), BS+, soft, nontender Extremities: normal range of motion, no edema Neurologic: A&Ox3, grossly intact Skin: tan skin, erythematous macular rash on abdomen, bilateral arms, back; does not involve palms or soles. No lesions in finger webbing. Multiple large SKs on back.  Right arm manifestations of rash 3/15:  Lesion on left groin 3/15:    Lab Results: Basic Metabolic Panel:  Recent Labs Lab 02/04/15 0815 02/04/15 2045  NA 138 139  K 3.1* 3.1*  CL 107 108  CO2 26 22  GLUCOSE 88 89  BUN 9 9  CREATININE 0.97 0.98  CALCIUM 8.0* 8.0*  MG 2.0  --    Liver Function Tests:  Recent Labs Lab 02/03/15 0228 02/04/15 0815  AST 21 13  ALT 15 11  ALKPHOS 83 64  BILITOT 0.9 0.9  PROT 6.4 4.7*  ALBUMIN 3.3* 2.3*    Recent  Labs Lab 02/03/15 0222  LIPASE 26   CBC:  Recent Labs Lab 02/04/15 0815 02/04/15 2045  WBC 8.0 8.1  HGB 8.3* 8.5*  HCT 24.5* 25.0*  MCV 87.5 87.1  PLT 397 413*   Cardiac Enzymes:  Recent Labs Lab 02/03/15 0708  TROPONINI <0.03   D-Dimer:  Recent Labs Lab 02/03/15 0803  DDIMER 1.35*   Coagulation:  Recent Labs Lab 02/04/15 0815  LABPROT 14.5  INR 1.12   Anemia Panel:  Recent Labs Lab 02/04/15 0815  VITAMINB12 308  RETICCTPCT 2.3   Urinalysis:  Recent Labs Lab 02/03/15 0718  COLORURINE YELLOW  LABSPEC 1.021  PHURINE 8.5*  GLUCOSEU 100*  HGBUR NEGATIVE  BILIRUBINUR NEGATIVE  KETONESUR 15*  PROTEINUR 100*  UROBILINOGEN 1.0  NITRITE NEGATIVE  LEUKOCYTESUR NEGATIVE    Micro Results: Recent Results (from the past 240 hour(s))  MRSA PCR Screening     Status: None   Collection Time: 02/04/15  5:21 AM  Result Value Ref Range Status   MRSA by PCR NEGATIVE NEGATIVE Final    Comment:        The GeneXpert MRSA Assay (FDA approved for NASAL specimens only), is one component of a comprehensive MRSA colonization surveillance  program. It is not intended to diagnose MRSA infection nor to guide or monitor treatment for MRSA infections.    Studies/Results: Dg Chest 2 View  02/03/2015   CLINICAL DATA:  Difficulty breathing after exposure to insecticide  EXAM: CHEST  2 VIEW  COMPARISON:  None.  FINDINGS: Lungs are clear. Heart size and pulmonary vascularity are normal. No adenopathy. No bone lesions.  IMPRESSION: No edema or consolidation.   Electronically Signed   By: Lowella Grip III M.D.   On: 02/03/2015 08:52   Dg Abd 1 View  02/03/2015   CLINICAL DATA:  Recent insect aside exposure with rash  EXAM: ABDOMEN - 1 VIEW  COMPARISON:  None.  FINDINGS: Scattered large and small bowel gas is noted. Rim-like calcifications are noted in the right upper quadrant likely related to gallstones. No obstructive changes are seen. Mild amount of fecal material  is noted within the rectum. Degenerative changes of the lumbar spine are seen with a mild scoliosis concave to the left.  IMPRESSION: Mild degenerative changes of the lumbar spine.  Cholelithiasis.   Electronically Signed   By: Inez Catalina M.D.   On: 02/03/2015 08:58   Ct Angio Chest Pe W/cm &/or Wo Cm  02/03/2015   CLINICAL DATA:  Chest pain and short of breath.  Rash 8 weeks  EXAM: CT ANGIOGRAPHY CHEST WITH CONTRAST  TECHNIQUE: Multidetector CT imaging of the chest was performed using the standard protocol during bolus administration of intravenous contrast. Multiplanar CT image reconstructions and MIPs were obtained to evaluate the vascular anatomy.  CONTRAST:  197mL OMNIPAQUE IOHEXOL 350 MG/ML SOLN  COMPARISON:  Chest x-ray 02/03/2015  FINDINGS: Negative for pulmonary emboli. Negative for aortic dissection or aneurysm. Minimal atherosclerotic disease in the aortic arch. Negative for pericardial effusion. Heart size is upper normal.  Lungs are clear without infiltrate or effusion.  Negative for mass or adenopathy.  Calcified gallstones.  No acute skeletal abnormality.  Review of the MIP images confirms the above findings.  IMPRESSION: Negative for pulmonary embolism.  No acute abnormality in the chest  Cholelithiasis.   Electronically Signed   By: Franchot Gallo M.D.   On: 02/03/2015 10:06   Medications: I have reviewed the patient's current medications. Scheduled Meds: . [START ON 02/07/2015] pantoprazole (PROTONIX) IV  40 mg Intravenous Q12H   Continuous Infusions: . 0.9 % NaCl with KCl 40 mEq / L 100 mL/hr (02/04/15 0842)  . pantoprozole (PROTONIX) infusion 8 mg/hr (02/04/15 1616)   PRN Meds:.ondansetron Assessment/Plan: Principal Problem:   Upper GI bleed Active Problems:   Melena   Hematemesis   Rash and nonspecific skin eruption   Hypokalemia   Protein-calorie malnutrition, severe  Jon Ramirez is a 69 yo man with GERD who presented with a rash and then reported melena for the  past 7 days and nausea/hematemsis on the morning of admission. He was taken for EGD and found to have a large antral and prepyloric ulcer with bleeding stigmata. His hemoglobin is stable; he also has a rash suspicious for scabies.  Upper GI Bleed: Peptic ulcer bleed or Ellarie Picking-Weiss tear were considered on this patient's presentation as causes of his hematemesis and melena. Though he has had no further episodes, on EGD, he was found to have a large distal stomach ulcer with bleeding stigmata and small adherent clot. It was not actively bleeding and clot could not be dislodged. Hemoglobin 9.7-->8.4-->8.3-->8.5. Colonoscopy in 2015 was only significant for internal hemorrhoids. - If patient continues to refuse IV  and would like to leave today, he was told he would have to leave AMA; patient amenable to staying until it is safe to go home - Appreciate GI consult and EGD - Transferred from SDU to telemetry bed - Protonix infusion to be continued for first 72 hours; then will need protonix 40 mg BID until safe to leave hospital, followed by maintenance antisecretory therapy as outpatient (40 mg BID for 3 months followed by daily indefinitely (patient appears to have had multiple occurrences per year)  - Follow up EGD in 3-4 months - Continue to hold NSAID  - Heliobacter pylori abs-IgG+IgA test; if positive, can treat with triple therapy (PPI, amoxicillin and clarithromycin) pending - CBC q12 to trend hemoglobin; will transfuse if <7 (am pending) - IVF NS 100 mL/hr while NPO - Telemetry  Erythematous Papular Rash: Appears to be consistent with scabies, and per patient report, has responded somewhat to permethrin solution at home. Could also consider syphilitic rash if he does not respond to treatment. No contacts at home. Permetrhin cream applied yesterday and now rinsed. - Patient will need to apply permethrin cream 5% for 2nd time in 1 week; leave on for 8-14 hours, then rinse  Hypokalemia: 2.5 on  admission-->3.1-->3.1. Given three runs of potassium chloride overnight. Concern that oral potassium actually can exacerbate gastric ulcers. Will stop supplementing orally at present time. Will try to correct prior to discharge. - Trend BMET (tomo am) - K with IVF  Nausea: Patient has not been requiring this PRN medication. - Zofran 4 mg q6 hours PRN (this is a home medication)  History of IBS: Patient's description of his symptoms do not fit an IBS profile (he has never had diarrhea, described chest burning and occasional emesis). Question validity of this diagnosis.   Diet: ADAT from soft diet  DVT Ppx: SCDs  Dispo: Disposition is deferred at this time, awaiting improvement of current medical problems.  Anticipated discharge in approximately 1 day(s).   The patient does not have a current PCP (No primary care provider on file.) and does not need an [facility] hospital follow-up appointment after discharge.  The patient does not have transportation limitations that hinder transportation to clinic appointments.  .Services Needed at time of discharge: Y = Yes, Blank = No PT:   OT:   RN:   Equipment:   Other:     LOS: 2 days   Karlene Einstein, MD 02/05/2015, 7:25 AM

## 2015-02-05 NOTE — Discharge Instructions (Signed)
Please take the omeprazole pill twice per day to protect your stomach from worsening ulcers. THIS IS VERY IMPORTANT, even if it isn't helping your symptoms.  Please follow up with your primary doctor and the gastroenterologist (see appointments). They will monitor your blood level, potassium level and the status of your ulcer. You will likely need another endoscopy to monitor your stomach in about 3-4 months.  Please apply the permethrin cream to your entire body below the neck on 3/23. Leave the cream on for 8-14 hours, then rinse. This will complete your treatment for the scabies.

## 2015-02-05 NOTE — Discharge Summary (Signed)
PATIENT LEFT AGAINST MEDICAL ADVICE  Name: Jon Ramirez MRN: [id number] DOB: 06/20/1946 69 y.o. PCP: No primary care provider on file.  Date of Admission: 02/03/2015  6:19 AM Date of Discharge: 02/05/2015 Attending Physician: Annia Belt, MD  Discharge Diagnosis: Principal Problem:   Upper GI bleed Active Problems:   Melena   Hematemesis   Rash and nonspecific skin eruption   Hypokalemia   Protein-calorie malnutrition, severe  Discharge Medications:   Medication List    STOP taking these medications        ibuprofen 200 MG tablet  Commonly known as:  ADVIL,MOTRIN      TAKE these medications        omeprazole 40 MG capsule  Commonly known as:  PRILOSEC  Take 1 capsule (40 mg total) by mouth 2 (two) times daily.     ondansetron 4 MG tablet  Commonly known as:  ZOFRAN  Take 1 tablet (4 mg total) by mouth every 6 (six) hours.     oxyCODONE-acetaminophen 5-325 MG per tablet  Commonly known as:  PERCOCET/ROXICET  Take 1-2 tablets by mouth every 6 (six) hours as needed for pain.     permethrin 5 % cream  Commonly known as:  ACTICIN  Apply 1 application topically once. Apply once on 3/23//16 to body below the neck; leave on for 8-14 hours, then rinse.        Disposition and follow-up:   Jon Ramirez was discharged from [facility] in Stable condition.  At the hospital follow up visit please address:  1.  Upper GI Bleed: Found to have peptic ulcer on EGD and required treatment with IV protonix; patient left AMA before his treatment was complete. He was to have another 24 hours of protonix infusion to be followed by IV BID protonix dosing. He was made aware of the dangers of leaving without this treatment. A prescription for outpatient omeprazole was called in to his pharmacy (40 mg BID). Follow up with PCP and GI were arranged.  Papular Rash: Appeared to have scabies. Received treatment 1 of 2 of permethrin cream. Improved slightly. To  complete treatment in 1 week at home (prescription called in).   Hypokalemia: Patient corrected from 2.5-->3.1 with supplementation. PO potassium can exacerbate ulcers, so patient was sent out without any potassium supplementation (was receiving IV potassium in the hospital).  2.  Labs / imaging needed at time of follow-up: BMET for potassium  3.  Pending labs/ test needing follow-up: Heliobacter pylori abs-IgG+IgA test; if positive, can treat with triple therapy (PPI, amoxicillin and clarithromycin)  Follow-up Appointments:     Follow-up Information    Follow up with Gara Kroner, MD.   Specialty:  Family Medicine   Why:  in the next week   Contact information:   [place] [address] [phone]       Follow up with Lear Ng., MD.   Specialty:  Gastroenterology   Contact information:   [address] [place] [address] [phone]       Discharge Instructions: PATIENT LEFT AMA, but was told the importance of staying to receive IV protonix. He was also advised that if he was to leave, he needed close GI follow-up and to fill his protonix prescription as an outpatient. RN called him to tell him where prescription was sent.   Consultations: GI    Procedures Performed:  Dg Chest 2 View  02/03/2015   CLINICAL DATA:  Difficulty breathing after exposure to insecticide  EXAM: CHEST  2 VIEW  COMPARISON:  None.  FINDINGS: Lungs are clear. Heart size and pulmonary vascularity are normal. No adenopathy. No bone lesions.  IMPRESSION: No edema or consolidation.   Electronically Signed   By: Lowella Grip III M.D.   On: 02/03/2015 08:52   Dg Abd 1 View  02/03/2015   CLINICAL DATA:  Recent insect aside exposure with rash  EXAM: ABDOMEN - 1 VIEW  COMPARISON:  None.  FINDINGS: Scattered large and small bowel gas is noted. Rim-like calcifications are noted in the right upper quadrant likely related to gallstones. No obstructive  changes are seen. Mild amount of fecal material is noted within the rectum. Degenerative changes of the lumbar spine are seen with a mild scoliosis concave to the left.  IMPRESSION: Mild degenerative changes of the lumbar spine.  Cholelithiasis.   Electronically Signed   By: Inez Catalina M.D.   On: 02/03/2015 08:58   Ct Angio Chest Pe W/cm &/or Wo Cm  02/03/2015   CLINICAL DATA:  Chest pain and short of breath.  Rash 8 weeks  EXAM: CT ANGIOGRAPHY CHEST WITH CONTRAST  TECHNIQUE: Multidetector CT imaging of the chest was performed using the standard protocol during bolus administration of intravenous contrast. Multiplanar CT image reconstructions and MIPs were obtained to evaluate the vascular anatomy.  CONTRAST:  12mL OMNIPAQUE IOHEXOL 350 MG/ML SOLN  COMPARISON:  Chest x-ray 02/03/2015  FINDINGS: Negative for pulmonary emboli. Negative for aortic dissection or aneurysm. Minimal atherosclerotic disease in the aortic arch. Negative for pericardial effusion. Heart size is upper normal.  Lungs are clear without infiltrate or effusion.  Negative for mass or adenopathy.  Calcified gallstones.  No acute skeletal abnormality.  Review of the MIP images confirms the above findings.  IMPRESSION: Negative for pulmonary embolism.  No acute abnormality in the chest  Cholelithiasis.   Electronically Signed   By: Franchot Gallo M.D.   On: 02/03/2015 10:06    Admission HPI: Jon Ramirez is a 69 yo man with a history of irritable bowel syndrome and GERD who presented to the ED after an episode of hematemesis. He has a 10 year history of what he calls "GERD and IBS". Episodes consist of abdominal burning that is not relieved by alka seltzer or mylanta, and 5-6 rounds of vomiting that relieves the pain. He states that he these episodes occur once every 4-5 months. He had one last week and another last night. During each, he noted dark, red-tinged vomitus. He has also noted darkened stool for the last week. He denies  dizziness, light-headedness or increase in his abdominal pain. He also denies current alcohol use or a significant history of alcohol use, or excessive NSAID use; he reports taking ibuprofen once every few weeks.   Also over the past 8 weeks, he has developed a progressive rash. It started in his suprapubic region and has spread up his trunk and to his arms. It is pruritic, but not painful. He initially treated it with lotion, then clorox soaks and tea tree oil. When it kept spreading, he switched two days ago to permethrin solution (10%, purchased online); the rash has responded slightly to permethrin. He has a history of what he calls "sensitive skin", as he has reacted to poison ivy in the past and has dry patches on his shins and arms from time to time. However, he denies any new exposures, new soaps or new detergents. He also denies much  sun exposure. He is not sexually active. Of note, the patient reports many recent travels, including a trip to [place], the [place] and [place]. He has no household contacts, as he lives alone, but he does report staying in a "sketchy motel" on his trip to [place]. He believes he contracted scabies there.  Hospital Course by problem list: Principal Problem:   Upper GI bleed Active Problems:   Melena   Hematemesis   Rash and nonspecific skin eruption   Hypokalemia   Protein-calorie malnutrition, severe   Jon Ramirez is a 69 yo man with GERD who presented with a rash and then reported melena for the past 7 days and nausea/hematemsis on the morning of admission. He was taken for EGD and found to have a large antral and prepyloric ulcer with bleeding stigmata. His hemoglobin is stable; he also has a rash suspicious for scabies.  Upper GI Bleed: Peptic ulcer bleed or Searcy Miyoshi-Weiss tear were considered on this patient's presentation as causes of his hematemesis and melena. Though he has had no further episodes, on EGD, he was found to have a large distal  stomach ulcer with bleeding stigmata and small adherent clot. It was not actively bleeding and clot could not be dislodged. Hemoglobin 9.7-->8.4-->8.3-->8.5. Colonoscopy in 2015 was only significant for internal hemorrhoids. Protonix infusion was to be continued for first 72 hours; then protonix 40 mg BID until safe to leave hospital, followed by maintenance antisecretory therapy as outpatient (40 mg BID for 3 months followed by daily indefinitely (patient appears to have had multiple occurrences per year); however, patient left AMA. Needs repeat EGD in 3-4 months. Advised to continue to hold NSAID. Heliobacter pylori abs-IgG+IgA test; if positive, can treat with triple therapy (PPI, amoxicillin and clarithromycin) pending. Did not require transfusion (lowest hemoglobin 8.3).  Erythematous Papular Rash: Appears to be consistent with scabies, and per patient report, has responded somewhat to permethrin solution at home. Could also consider syphilitic rash if he does not respond to treatment. No contacts at home. Permetrhin cream applied yesterday and now rinsed. Patient will need to apply permethrin cream 5% for 2nd time in 1 week; leave on for 8-14 hours, then rinse.  Hypokalemia: 2.5 on admission-->3.1. Given IV potassium. Concern that oral potassium actually can gastric ulcers.   Nausea: Patient has not been requiring Zofran 4 mg q6 hours PRN (this is a home medication).  History of IBS: Patient's description of his symptoms do not fit an IBS profile (he has never had diarrhea, described chest burning and occasional emesis). Question validity of this diagnosis.   Discharge Vitals:   BP 140/69 mmHg  Pulse 89  Temp(Src) 98.6 F (37 C) (Oral)  Resp 17  Ht 6' (1.829 m)  Wt 177 lb 7.5 oz (80.5 kg)  BMI 24.06 kg/m2  SpO2 100%  Discharge Labs:  Results for orders placed or performed during the hospital encounter of 02/03/15 (from the past 24 hour(s))  Basic metabolic panel     Status: Abnormal    Collection Time: 02/04/15  8:45 PM  Result Value Ref Range   Sodium 139 135 - 145 mmol/L   Potassium 3.1 (L) 3.5 - 5.1 mmol/L   Chloride 108 96 - 112 mmol/L   CO2 22 19 - 32 mmol/L   Glucose, Bld 89 70 - 99 mg/dL   BUN 9 6 - 23 mg/dL   Creatinine, Ser 0.98 0.50 - 1.35 mg/dL   Calcium 8.0 (L) 8.4 - 10.5 mg/dL   GFR calc  non Af Amer 84 (L) >90 mL/min   GFR calc Af Amer >90 >90 mL/min   Anion gap 9 5 - 15  CBC     Status: Abnormal   Collection Time: 02/04/15  8:45 PM  Result Value Ref Range   WBC 8.1 4.0 - 10.5 K/uL   RBC 2.87 (L) 4.22 - 5.81 MIL/uL   Hemoglobin 8.5 (L) 13.0 - 17.0 g/dL   HCT 25.0 (L) 39.0 - 52.0 %   MCV 87.1 78.0 - 100.0 fL   MCH 29.6 26.0 - 34.0 pg   MCHC 34.0 30.0 - 36.0 g/dL   RDW 14.2 11.5 - 15.5 %   Platelets 413 (H) 150 - 400 K/uL    Signed: Karlene Einstein, MD 02/05/2015, 11:33 AM    Services Ordered on Discharge: none Equipment Ordered on Discharge: none

## 2015-02-05 NOTE — Progress Notes (Signed)
Patient ID: Jon Ramirez, male   DOB: 03/26/1946, 69 y.o.   MRN: [id number]  Patient refused IV Protonix this morning due to the machine beeping keeping him from sleeping. When I called his room and explained that he needs to remain on the Protonix drip due to the large ulcer he has he said that he has talked with medicine about being discharged on oral PPI. My recommendation is that he remain hospitalized on the Protonix drip for the entire 72 hours and then IV Q12 hours for a period of time dependent on his clinical status. When he is stable for discharge he will need to be on a Protonix (or similar PPI) 40 mg BID for 3 months and then QD indefinitely. Repeat EGD in 3-4 months to assess for healing. At this time I do NOT believe he is stable for discharge due to the high risk of rebleeding from his large gastric ulcer. My recommendation would be he would be leaving AMA if he wants to be discharged for the reasons stated above. D/W Dr. Sherrine Maples.

## 2015-06-24 ENCOUNTER — Other Ambulatory Visit: Payer: Self-pay | Admitting: Urology

## 2015-06-24 DIAGNOSIS — C61 Malignant neoplasm of prostate: Secondary | ICD-10-CM

## 2015-07-08 ENCOUNTER — Ambulatory Visit (HOSPITAL_COMMUNITY)
Admission: RE | Admit: 2015-07-08 | Discharge: 2015-07-08 | Disposition: A | Discharge status: Home / Self Care | Payer: Medicare Other | Source: Ambulatory Visit | Attending: Urology | Admitting: Urology

## 2015-07-08 DIAGNOSIS — M419 Scoliosis, unspecified: Secondary | ICD-10-CM | POA: Insufficient documentation

## 2015-07-08 DIAGNOSIS — C61 Malignant neoplasm of prostate: Secondary | ICD-10-CM | POA: Insufficient documentation

## 2015-07-08 MED ORDER — TECHNETIUM TC 99M MEDRONATE IV KIT
25.0000 | PACK | Freq: Once | INTRAVENOUS | Status: AC | PRN
  Administered 2015-07-08: 25 via INTRAVENOUS

## 2015-07-23 ENCOUNTER — Other Ambulatory Visit: Payer: Self-pay | Admitting: Urology

## 2015-07-23 ENCOUNTER — Ambulatory Visit (HOSPITAL_COMMUNITY)
Admission: RE | Admit: 2015-07-23 | Discharge: 2015-07-23 | Disposition: A | Discharge status: Home / Self Care | Payer: Medicare Other | Source: Ambulatory Visit | Attending: Urology | Admitting: Urology

## 2015-07-23 DIAGNOSIS — C61 Malignant neoplasm of prostate: Secondary | ICD-10-CM

## 2015-08-10 ENCOUNTER — Ambulatory Visit: Admission: RE | Admit: 2015-08-10 | Payer: Medicare Other | Source: Ambulatory Visit | Admitting: Radiation Oncology

## 2015-08-10 ENCOUNTER — Ambulatory Visit: Payer: Medicare Other

## 2015-08-18 ENCOUNTER — Telehealth: Payer: Self-pay | Admitting: *Deleted

## 2015-08-18 NOTE — Telephone Encounter (Signed)
LVM asking patient to return my call to reschedule appointment with Dr. Tammi Klippel

## 2017-08-17 IMAGING — CR DG LUMBAR SPINE 2-3V
2 series · 2 of 2 positions shown · non-contrast
Comparison: Nuclear medicine total body bone scan of 07/08/2015

CLINICAL DATA: Abnormal bone scan with activity at L1, history of
prostatic carcinoma

EXAM:
LUMBAR SPINE - 2-3 VIEW

[view not recorded (1 of 2)]
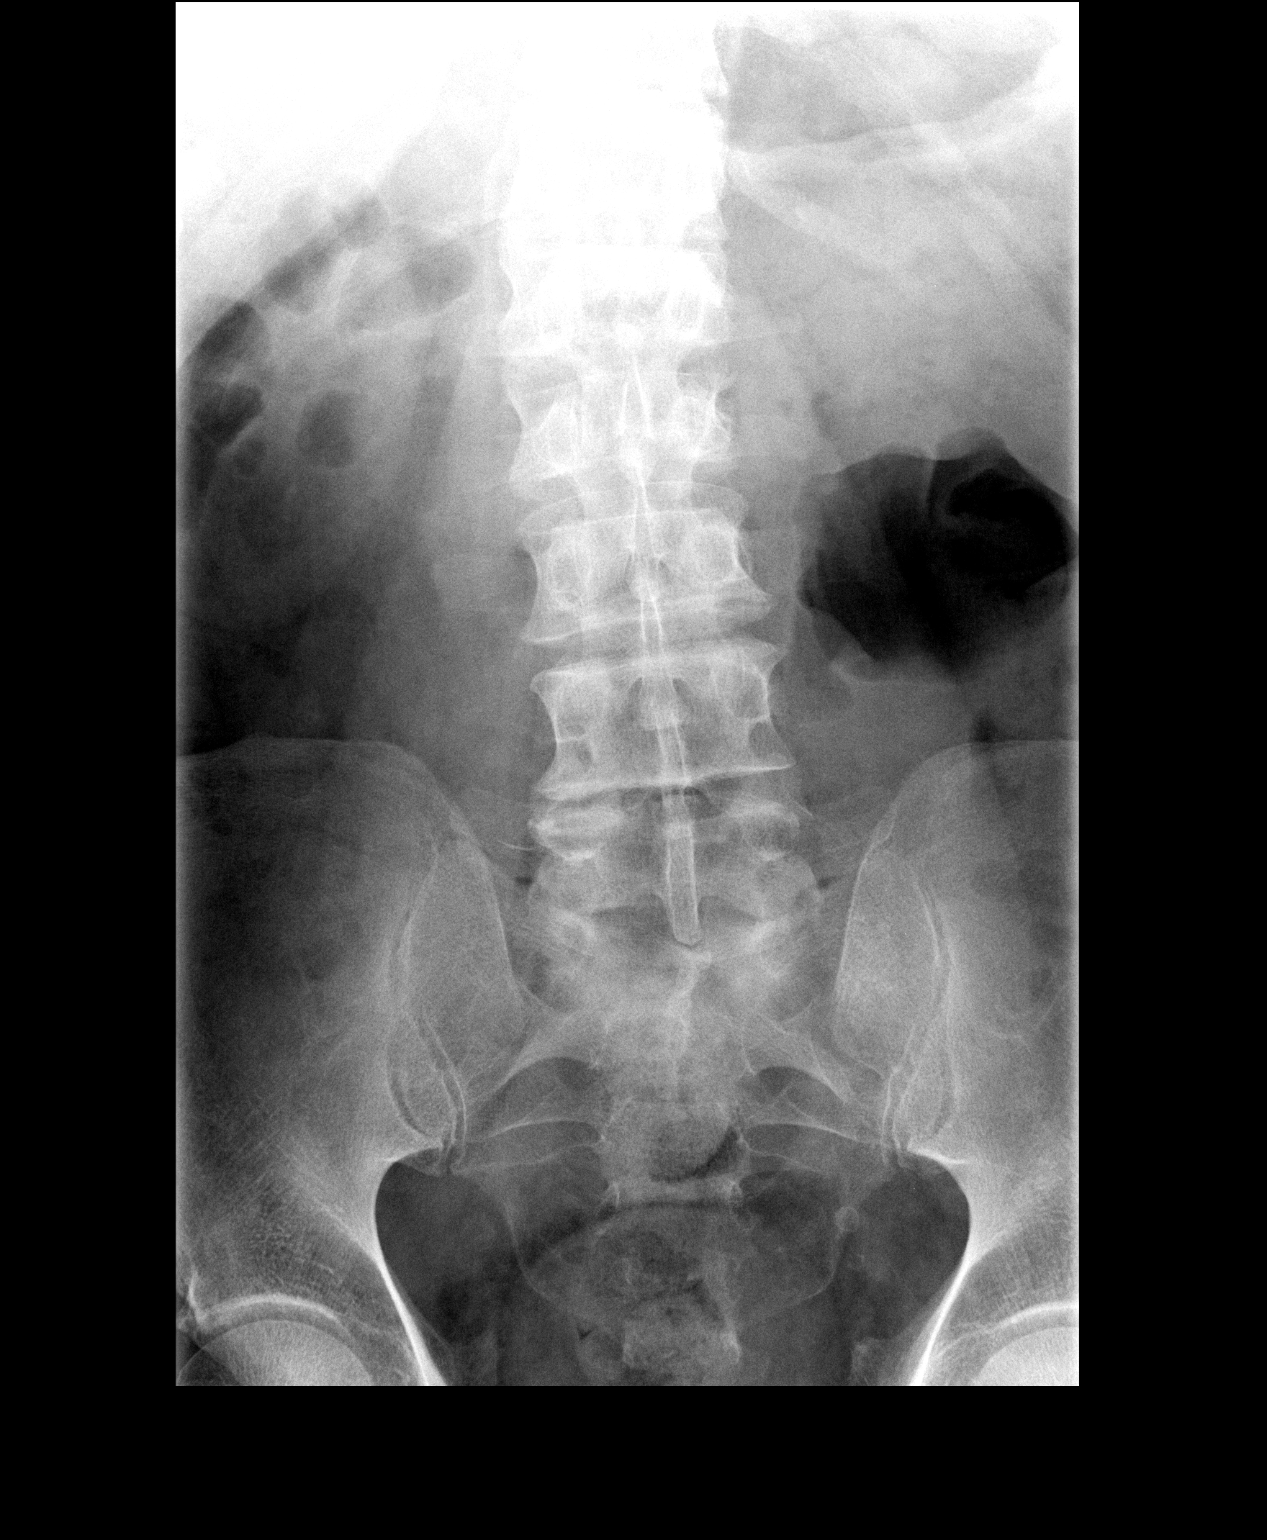

[view not recorded (2 of 2)]
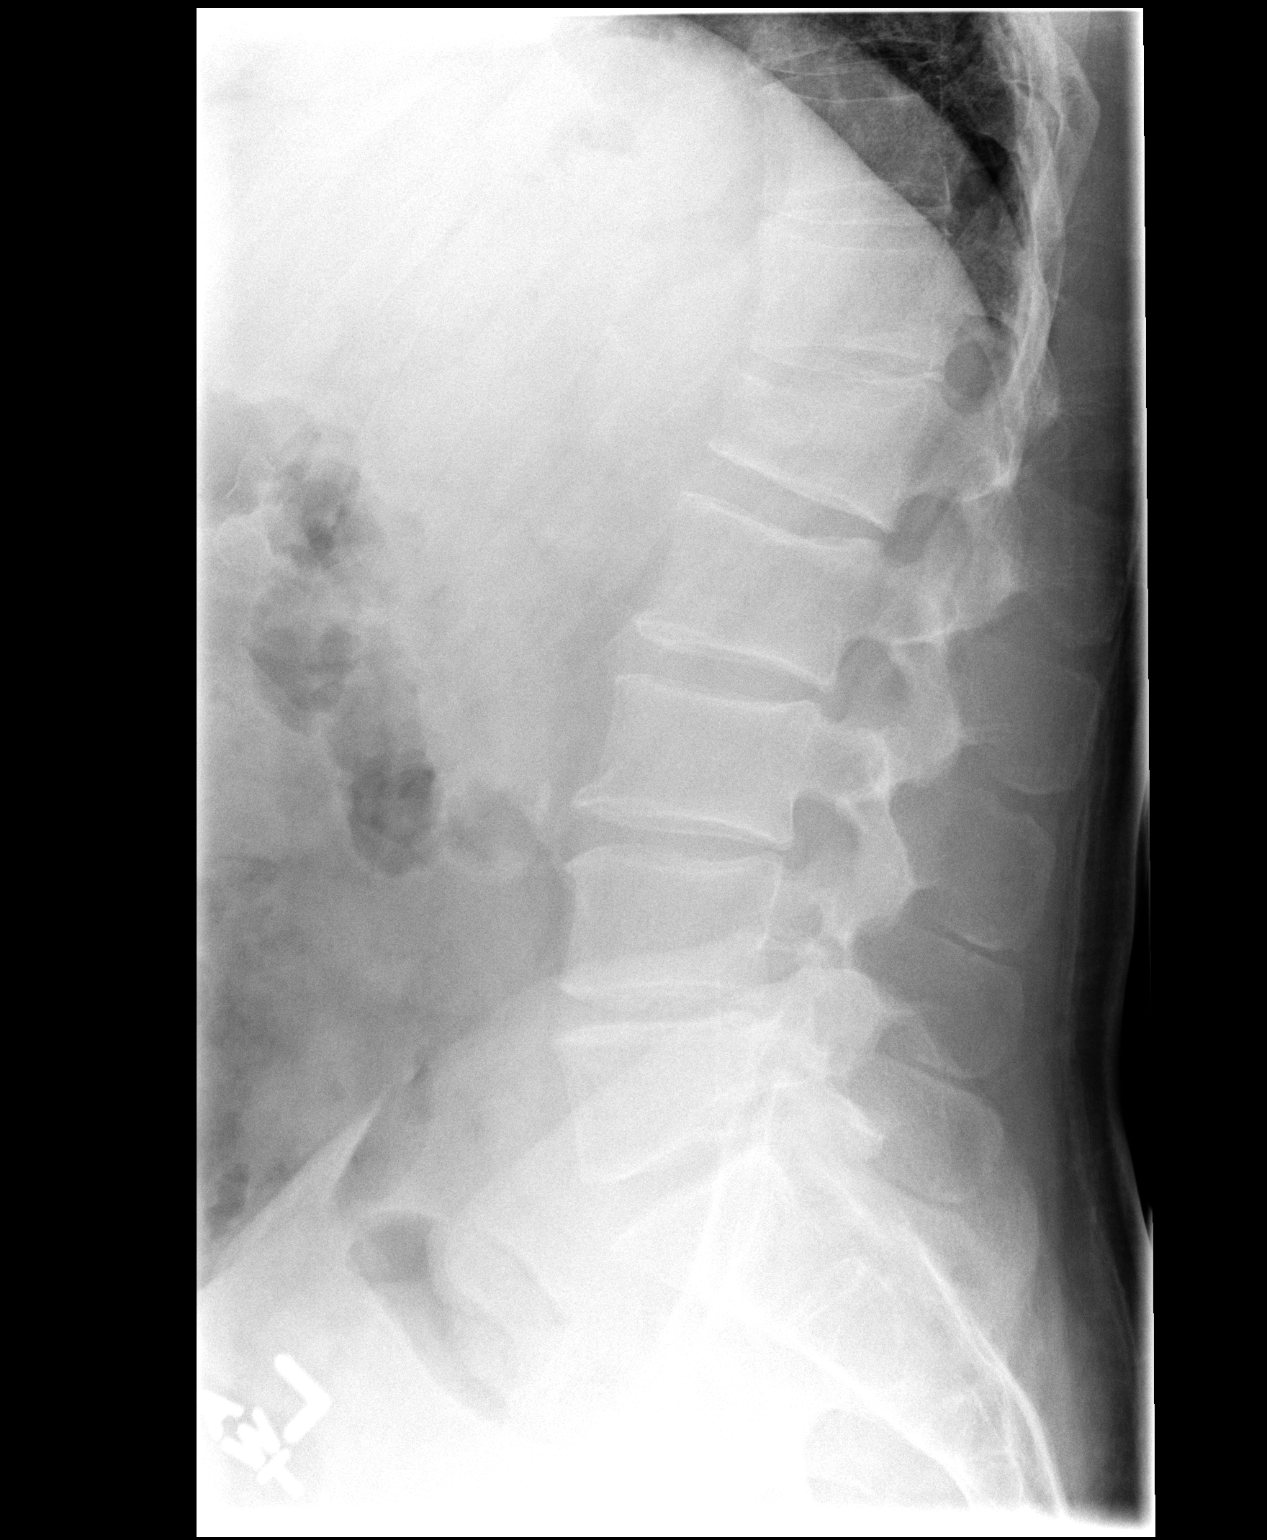

[2 of 2 positions shown; findings below may reference images not displayed]

FINDINGS: At the site of increased activity on bone scan, there is a partial
compression deformity of L1 vertebral body of approximately 40%. No
retropulsion is seen. No definite lytic or blastic lesion is noted.
There is mild degenerative disc disease at L3-4 and L4-5 levels. The
SI joints are corticated.
IMPRESSION: Approximately 40% compression deformity of L1 vertebral body most
likely accounts for the activity on recent bone scan. No definite
lytic or blastic bony lesion is seen.

## 2018-11-23 ENCOUNTER — Ambulatory Visit: Payer: Medicare Other | Admitting: Cardiovascular Disease

## 2018-11-23 ENCOUNTER — Encounter: Payer: Self-pay | Admitting: Cardiovascular Disease

## 2018-11-23 ENCOUNTER — Encounter (INDEPENDENT_AMBULATORY_CARE_PROVIDER_SITE_OTHER): Payer: Self-pay

## 2018-11-23 DIAGNOSIS — R9431 Abnormal electrocardiogram [ECG] [EKG]: Secondary | ICD-10-CM | POA: Diagnosis not present

## 2018-11-23 DIAGNOSIS — R0789 Other chest pain: Secondary | ICD-10-CM | POA: Insufficient documentation

## 2018-11-23 DIAGNOSIS — I10 Essential (primary) hypertension: Secondary | ICD-10-CM | POA: Insufficient documentation

## 2018-11-23 NOTE — Patient Instructions (Signed)
Medication Instructions:  NO CHANGE  If you need a refill on your cardiac medications before your next appointment, please call your pharmacy.   Lab work: Your physician recommends that you return for lab work: [place]  If you have labs (blood work) drawn today and your tests are completely normal, you will receive your results only by: Marland Kitchen MyChart Message (if you have MyChart) OR . A paper copy in the mail If you have any lab test that is abnormal or we need to change your treatment, we will call you to review the results.  Testing/Procedures: Your physician has requested that you have an echocardiogram. Echocardiography is a painless test that uses sound waves to create images of your heart. It provides your doctor with information about the size and shape of your heart and how well your heart's chambers and valves are working. This procedure takes approximately one hour. There are no restrictions for this procedure.  Your physician has requested that you have an exercise tolerance test. For further information please visit [email or web address]. Please also follow instruction sheet, as given.   Follow-Up: At [facility], you and your health needs are our priority.  As part of our continuing mission to provide you with exceptional heart care, we have created designated Provider Care Teams.  These Care Teams include your primary Cardiologist (physician) and Advanced Practice Providers (APPs -  Physician Assistants and Nurse Practitioners) who all work together to provide you with the care you need, when you need it. You MAY SCHEDULE a follow up appointment AS NEEDED.  Please call our office 2 months in advance to schedule this appointment.  You may see DR. BERRY or one of the following Advanced Practice Providers on your designated Care Team:   Kerin Ransom, PA-C [place], [place] . Sande Rives, PA-C

## 2018-11-23 NOTE — Assessment & Plan Note (Signed)
EKG today reveals poor R wave progression and left anterior fascicular block.

## 2018-11-23 NOTE — Progress Notes (Signed)
11/23/2018 Jon Ramirez   12/18/1945  [id number]  Primary Physician Jon Contras, MD Primary Cardiologist: Jon Harp MD Jon Ramirez, [place]  HPI:  Jon Ramirez is a 73 y.o. thin appearing divorced Caucasian male with no children who is retired from being an Producer, television/film/video.  He was referred by Dr. Moreen Ramirez for cardiovascular valuation because of hypertension and an abnormal EKG.  He has no cardiac risk factors other than essential hypertension on therapy.  He is never had a cardiac or stroke.  He denies shortness of breath but does get occasional chest pain.  There is no family history for heart disease.  He has never smoked.  Drinks socially.  He was recently begun on chlorthalidone and valsartan for hypertension.     Current Meds  Medication Sig  . chlorthalidone (HYGROTON) 25 MG tablet Take 12.5 mg by mouth daily.  . Multiple Vitamin (MULTIVITAMIN) tablet Take 1 tablet by mouth daily.  . valsartan (DIOVAN) 160 MG tablet Take 160 mg by mouth daily.     No Known Allergies  Social History   Socioeconomic History  . Marital status: Divorced    Spouse name: Not on file  . Number of children: Not on file  . Years of education: Not on file  . Highest education level: Not on file  Occupational History  . 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  Social Needs  . Financial resource strain: Not on file  . Food insecurity:    Worry: Not on file    Inability: Not on file  . Transportation needs:    Medical: Not on file    Non-medical: Not on file  Tobacco Use  . Smoking status: Never Smoker  Substance and Sexual Activity  . Alcohol use: No  . Drug use: No  . Sexual activity: Not on file  Lifestyle  . Physical activity:    Days per week: Not on file    Minutes per session: Not on file  . Stress: Not on file  Relationships  . Social connections:    Talks on phone: Not on file    Gets together: Not on file    Attends religious service: Not on file    Active member of club or organization: Not on file    Attends meetings of clubs or organizations: Not on file    Relationship status: Not on file  . Intimate partner violence:    Fear of current or ex partner: Not on file    Emotionally abused: Not on file    Physically abused: Not on file    Forced sexual activity: Not on file  Other Topics Concern  . Not on file  Social History Narrative  . Not on file     Review of Systems: General: negative for chills, fever, night sweats or weight changes.  Cardiovascular: negative for chest pain, dyspnea on exertion, edema, orthopnea, palpitations, paroxysmal nocturnal dyspnea or shortness of breath Dermatological: negative for rash Respiratory: negative for cough or wheezing Urologic: negative for hematuria Abdominal: negative for nausea, vomiting, diarrhea, bright red blood per rectum, melena, or hematemesis Neurologic: negative for visual changes, syncope, or dizziness All other systems reviewed and are otherwise negative except as noted above.    Blood pressure 126/80, pulse 91, height 6' (1.829 m), weight 198 lb 3.2 oz (89.9 kg).  General appearance: alert and no distress Neck: no adenopathy, no carotid bruit, no JVD, supple, symmetrical, trachea midline and thyroid not  enlarged, symmetric, no tenderness/mass/nodules Lungs: clear to auscultation bilaterally Heart: regular rate and rhythm, S1, S2 normal, no murmur, click, rub or gallop Extremities: extremities normal, atraumatic, no cyanosis or edema Pulses: 2+ and symmetric Skin: Skin color, texture, turgor normal. No rashes or lesions Neurologic: Alert and oriented X 3, normal strength and tone. Normal symmetric reflexes. Normal coordination and gait  EKG sinus rhythm at 91 with poor R wave progression and left anterior fascicular block.  I personally reviewed this EKG.  ASSESSMENT AND PLAN:   Abnormal EKG EKG today reveals poor R wave progression and left anterior fascicular  block.  Essential hypertension History of essential hypertension blood pressure measured at 126/80.  He is on chlorthalidone and valsartan.  Atypical chest pain History of atypical chest pain with positive risk factors.  We will get a routine GXT to further evaluate.      Jon Harp MD FACP,FACC,FAHA, [facility] 11/23/2018 3:13 PM

## 2018-11-23 NOTE — Assessment & Plan Note (Signed)
History of atypical chest pain with positive risk factors.  We will get a routine GXT to further evaluate.

## 2018-11-23 NOTE — Assessment & Plan Note (Signed)
History of essential hypertension blood pressure measured at 126/80.  He is on chlorthalidone and valsartan.

## 2018-11-28 ENCOUNTER — Telehealth (HOSPITAL_COMMUNITY): Payer: Self-pay

## 2018-11-28 ENCOUNTER — Ambulatory Visit (HOSPITAL_COMMUNITY): Payer: Medicare Other | Attending: Cardiology

## 2018-11-28 ENCOUNTER — Other Ambulatory Visit: Payer: Self-pay

## 2018-11-28 DIAGNOSIS — R0789 Other chest pain: Secondary | ICD-10-CM

## 2018-11-28 NOTE — Telephone Encounter (Signed)
Encounter complete. 

## 2018-11-29 ENCOUNTER — Telehealth (HOSPITAL_COMMUNITY): Payer: Self-pay

## 2018-11-29 NOTE — Telephone Encounter (Signed)
Encounter complete,

## 2018-11-30 ENCOUNTER — Telehealth (HOSPITAL_COMMUNITY): Payer: Self-pay

## 2018-11-30 ENCOUNTER — Ambulatory Visit (HOSPITAL_COMMUNITY)
Admission: RE | Admit: 2018-11-30 | Discharge: 2018-11-30 | Disposition: A | Discharge status: Home / Self Care | Payer: Medicare Other | Source: Ambulatory Visit | Attending: Cardiovascular Disease | Admitting: Cardiovascular Disease

## 2018-11-30 DIAGNOSIS — R0789 Other chest pain: Secondary | ICD-10-CM | POA: Diagnosis present

## 2018-11-30 NOTE — Telephone Encounter (Signed)
Encounter complete. 

## 2018-12-03 LAB — EXERCISE TOLERANCE TEST
CSEPED: 7 min
CSEPHR: 100 %
Estimated workload: 9 METS
Exercise duration (sec): 21 s
MPHR: 150 {beats}/min
Peak HR: 150 {beats}/min
RPE: 19
Rest HR: 82 {beats}/min

## 2020-03-13 ENCOUNTER — Ambulatory Visit: Payer: Medicare Other | Attending: Internal Medicine

## 2020-03-13 DIAGNOSIS — Z23 Encounter for immunization: Secondary | ICD-10-CM

## 2020-03-13 NOTE — Progress Notes (Signed)
   Covid-19 Vaccination Clinic  Name:  Jon Ramirez    MRN: [id number] DOB: 1946-03-03  03/13/2020  [name] was observed post Covid-19 immunization for 15 minutes without incident. He was provided with Vaccine Information Sheet and instruction to access the V-Safe system.   [name] was instructed to call 911 with any severe reactions post vaccine: Marland Kitchen Difficulty breathing  . Swelling of face and throat  . A fast heartbeat  . A bad rash all over body  . Dizziness and weakness   Immunizations Administered    Name Date Dose VIS Date Route   Pfizer COVID-19 Vaccine 03/13/2020  2:55 PM 0.3 mL 01/15/2019 Intramuscular   Manufacturer: [place]   Lot: [id number]   [place]: [id number]

## 2020-04-06 ENCOUNTER — Ambulatory Visit: Payer: Medicare Other | Attending: Internal Medicine

## 2020-04-06 DIAGNOSIS — Z23 Encounter for immunization: Secondary | ICD-10-CM

## 2020-04-06 NOTE — Progress Notes (Signed)
   Covid-19 Vaccination Clinic  Name:  Jon Ramirez    MRN: [id number] DOB: Mar 24, 1946  04/06/2020  [name] was observed post Covid-19 immunization for 15 minutes without incident. He was provided with Vaccine Information Sheet and instruction to access the V-Safe system.   [name] was instructed to call 911 with any severe reactions post vaccine: Marland Kitchen Difficulty breathing  . Swelling of face and throat  . A fast heartbeat  . A bad rash all over body  . Dizziness and weakness   Immunizations Administered    Name Date Dose VIS Date Route   Pfizer COVID-19 Vaccine 04/06/2020  3:14 PM 0.3 mL 01/15/2019 Intramuscular   Manufacturer: [place]   Lot: [id number]   [place]: [id number]

## 2020-12-16 ENCOUNTER — Encounter (INDEPENDENT_AMBULATORY_CARE_PROVIDER_SITE_OTHER): Payer: Self-pay | Admitting: Otolaryngology

## 2020-12-16 ENCOUNTER — Other Ambulatory Visit: Payer: Self-pay

## 2020-12-16 ENCOUNTER — Ambulatory Visit (INDEPENDENT_AMBULATORY_CARE_PROVIDER_SITE_OTHER): Payer: Medicare Other | Admitting: Otolaryngology

## 2020-12-16 VITALS — Temp 97.9°F

## 2020-12-16 DIAGNOSIS — D485 Neoplasm of uncertain behavior of skin: Secondary | ICD-10-CM | POA: Diagnosis not present

## 2020-12-16 DIAGNOSIS — H6123 Impacted cerumen, bilateral: Secondary | ICD-10-CM

## 2020-12-16 NOTE — Progress Notes (Signed)
HPI: Jon Ramirez is a 75 y.o. male who presents for evaluation of a lesion he is noted on the right side of the necks for the last several weeks.  He also has decreased hearing with wax buildup in both ear canals.  There is a question about a foreign body in the right ear canal.  No past medical history on file. Past Surgical History:  Procedure Laterality Date  . ESOPHAGOGASTRODUODENOSCOPY N/A 02/03/2015   Procedure: ESOPHAGOGASTRODUODENOSCOPY (EGD);  Surgeon: Jon Corner, MD;  Location: [facility] ENDOSCOPY;  Service: Endoscopy;  Laterality: N/A;   Social History   Socioeconomic History  . Marital status: Divorced    Spouse name: Not on file  . Number of children: Not on file  . Years of education: Not on file  . Highest education level: Not on file  Occupational History  . 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  Tobacco Use  . Smoking status: Never Smoker  . Smokeless tobacco: Never Used  Substance and Sexual Activity  . Alcohol use: No  . Drug use: No  . Sexual activity: Not on file  Other Topics Concern  . Not on file  Social History Narrative  . Not on file   Social Determinants of Health   Financial Resource Strain: Not on file  Food Insecurity: Not on file  Transportation Needs: Not on file  Physical Activity: Not on file  Stress: Not on file  Social Connections: Not on file   No family history on file. No Known Allergies Prior to Admission medications   Medication Sig Start Date End Date Taking? Authorizing Provider  chlorthalidone (HYGROTON) 25 MG tablet Take 12.5 mg by mouth daily.    [provider]  Multiple Vitamin (MULTIVITAMIN) tablet Take 1 tablet by mouth daily.    [provider]  valsartan (DIOVAN) 160 MG tablet Take 160 mg by mouth daily.    [provider]     Positive ROS: Otherwise negative  All other systems have been reviewed and were otherwise negative with the exception of those mentioned in the HPI  and as above.  Physical Exam: Constitutional: Alert, well-appearing, no acute distress Ears: External ears without lesions or tenderness.  Both ear canals with a large amount of wax in the medial aspect of the ear canals.  He apparently has used Q-tips.  He feels a hard lump in the right ear which is actually just the hard wax.  This was cleaned with suction curette and forceps.  TMs were clear otherwise. Nasal: External nose without lesions. Septum with mild deformity. Clear nasal passages otherwise. Oral: Lips and gums without lesions. Tongue and palate mucosa without lesions. Posterior oropharynx clear. Neck: Patient with a elevated 1 to 1/2 cm firm almost ulcerative mass in the right mid neck does not appear to go deep as it is mobile.  There is no significant palpable adenopathy.  This is mostly consistent with probable neoplasm versus inflammatory. Respiratory: Breathing comfortably  Skin: No facial/neck lesions or rash noted.  Cerumen impaction removal  Date/Time: 12/16/2020 1:27 PM Performed by: Jon Nunnery, MD Authorized by: Jon Nunnery, MD   Consent:    Consent obtained:  Verbal   Consent given by:  Patient   Risks discussed:  Pain and bleeding Procedure details:    Location:  L ear and R ear   Procedure type: curette, suction and forceps   Post-procedure details:    Inspection:  TM intact and canal normal  Hearing quality:  Improved   Patient tolerance of procedure:  Tolerated well, no immediate complications Comments:     Ear canals were obstructed bilaterally because of Q-tip use.  This was cleaned in the office TMs were otherwise clear.    Assessment: Bilateral cerumen impactions Right neck lesion  Plan: Cautioned him about not using Q-tips in the ears. Concerning the neck lesion recommended follow-up with dermatology as they can remove this in the office.  Gave him the name of [facility] dermatology.  If he has difficulty scheduling appointment  with them I could remove this but would require visit to the operating room under local anesthesia.  He will call us back if he decides to have me remove this.  Jon Journey, MD

## 2020-12-17 DIAGNOSIS — D485 Neoplasm of uncertain behavior of skin: Secondary | ICD-10-CM | POA: Diagnosis not present

## 2020-12-17 DIAGNOSIS — C4442 Squamous cell carcinoma of skin of scalp and neck: Secondary | ICD-10-CM | POA: Diagnosis not present

## 2021-01-13 DIAGNOSIS — C4442 Squamous cell carcinoma of skin of scalp and neck: Secondary | ICD-10-CM | POA: Diagnosis not present

## 2021-03-24 DIAGNOSIS — I10 Essential (primary) hypertension: Secondary | ICD-10-CM | POA: Diagnosis not present

## 2021-03-24 DIAGNOSIS — E039 Hypothyroidism, unspecified: Secondary | ICD-10-CM | POA: Diagnosis not present

## 2021-03-24 DIAGNOSIS — N189 Chronic kidney disease, unspecified: Secondary | ICD-10-CM | POA: Diagnosis not present

## 2021-09-24 DIAGNOSIS — Z23 Encounter for immunization: Secondary | ICD-10-CM | POA: Diagnosis not present

## 2021-09-24 DIAGNOSIS — E039 Hypothyroidism, unspecified: Secondary | ICD-10-CM | POA: Diagnosis not present

## 2021-09-24 DIAGNOSIS — Z85828 Personal history of other malignant neoplasm of skin: Secondary | ICD-10-CM | POA: Diagnosis not present

## 2021-09-24 DIAGNOSIS — Z1389 Encounter for screening for other disorder: Secondary | ICD-10-CM | POA: Diagnosis not present

## 2021-09-24 DIAGNOSIS — Z0000 Encounter for general adult medical examination without abnormal findings: Secondary | ICD-10-CM | POA: Diagnosis not present

## 2021-09-24 DIAGNOSIS — N189 Chronic kidney disease, unspecified: Secondary | ICD-10-CM | POA: Diagnosis not present

## 2021-09-24 DIAGNOSIS — I10 Essential (primary) hypertension: Secondary | ICD-10-CM | POA: Diagnosis not present

## 2021-10-12 NOTE — Progress Notes (Signed)
RN received request to review referral placed by patient's PCP to see Dr. Alen Blew.   RN reviewed, patient with history of prostate cancer from 2016.  Pt saw urology in 05/2021 and declined treatment at that time.     RN reviewed request with Ashlyn, PA-C and Dr. Tammi Klippel due to Dr. Alen Blew being out out office.  Recommendations for patient to be referred to urology to obtain proper diagnosis (biopsy/pathology) prior to seeing Dr. Alen Blew.  Referral coordinator notified and will reach out to PCP to obtain appropriate referral to urology.

## 2021-10-21 ENCOUNTER — Telehealth: Payer: Self-pay | Admitting: Physician Assistant

## 2021-10-21 NOTE — Telephone Encounter (Signed)
Called pt to sch with the diagnostic clinic per RN Kathlee Nations. No answer. Left msg for pt to call me back to sch appt.

## 2021-10-22 ENCOUNTER — Telehealth: Payer: Self-pay | Admitting: Physician Assistant

## 2021-10-22 NOTE — Telephone Encounter (Signed)
Called pt to sch with the diagnostic clinic per RN Kathlee Nations. No answer. Left msg for pt to call me back to sch appt.

## 2021-10-25 ENCOUNTER — Telehealth: Payer: Self-pay | Admitting: Physician Assistant

## 2021-10-25 NOTE — Telephone Encounter (Signed)
Called pt to sch with the diagnostic clinic per RN Kathlee Nations. No answer. Left msg for pt to call me back to sch appt.

## 2021-10-26 ENCOUNTER — Telehealth: Payer: Self-pay | Admitting: Physician Assistant

## 2021-10-26 NOTE — Telephone Encounter (Signed)
Called pt to sch with the diagnostic clinic per RN Kathlee Nations. No answer. Left msg for pt to call me back to sch appt.

## 2021-11-09 ENCOUNTER — Telehealth: Payer: Self-pay | Admitting: Physician Assistant

## 2021-11-09 NOTE — Telephone Encounter (Signed)
Pt called in to sch appt from 11/7 referral. Pt apologized for just now getting back to [place], he said he had been traveling. I scheduled pt for next available appt that worked with his schedule. Pt is aware of appt date and time.

## 2021-12-07 ENCOUNTER — Inpatient Hospital Stay: Payer: Medicare Other | Attending: Physician Assistant | Admitting: Physician Assistant

## 2021-12-07 ENCOUNTER — Inpatient Hospital Stay: Payer: Medicare Other

## 2021-12-07 ENCOUNTER — Other Ambulatory Visit: Payer: Self-pay

## 2021-12-07 ENCOUNTER — Encounter: Payer: Self-pay | Admitting: Physician Assistant

## 2021-12-07 VITALS — BP 146/89 | HR 107 | Temp 97.5°F | Resp 18 | Wt 195.0 lb

## 2021-12-07 DIAGNOSIS — Z79899 Other long term (current) drug therapy: Secondary | ICD-10-CM | POA: Diagnosis not present

## 2021-12-07 DIAGNOSIS — I10 Essential (primary) hypertension: Secondary | ICD-10-CM | POA: Insufficient documentation

## 2021-12-07 DIAGNOSIS — E039 Hypothyroidism, unspecified: Secondary | ICD-10-CM | POA: Insufficient documentation

## 2021-12-07 DIAGNOSIS — Z85828 Personal history of other malignant neoplasm of skin: Secondary | ICD-10-CM | POA: Diagnosis not present

## 2021-12-07 DIAGNOSIS — C61 Malignant neoplasm of prostate: Secondary | ICD-10-CM | POA: Insufficient documentation

## 2021-12-07 DIAGNOSIS — Z9079 Acquired absence of other genital organ(s): Secondary | ICD-10-CM | POA: Diagnosis not present

## 2021-12-07 DIAGNOSIS — K5900 Constipation, unspecified: Secondary | ICD-10-CM | POA: Diagnosis not present

## 2021-12-07 LAB — CBC WITH DIFFERENTIAL (CANCER CENTER ONLY)
Abs Immature Granulocytes: 0.03 10*3/uL (ref 0.00–0.07)
Basophils Absolute: 0.1 10*3/uL (ref 0.0–0.1)
Basophils Relative: 1 %
Eosinophils Absolute: 0.2 10*3/uL (ref 0.0–0.5)
Eosinophils Relative: 3 %
HCT: 41.3 % (ref 39.0–52.0)
Hemoglobin: 15 g/dL (ref 13.0–17.0)
Immature Granulocytes: 0 %
Lymphocytes Relative: 24 %
Lymphs Abs: 1.7 10*3/uL (ref 0.7–4.0)
MCH: 31.6 pg (ref 26.0–34.0)
MCHC: 36.3 g/dL — ABNORMAL HIGH (ref 30.0–36.0)
MCV: 87.1 fL (ref 80.0–100.0)
Monocytes Absolute: 0.4 10*3/uL (ref 0.1–1.0)
Monocytes Relative: 5 %
Neutro Abs: 4.6 10*3/uL (ref 1.7–7.7)
Neutrophils Relative %: 67 %
Platelet Count: 249 10*3/uL (ref 150–400)
RBC: 4.74 MIL/uL (ref 4.22–5.81)
RDW: 13.2 % (ref 11.5–15.5)
WBC Count: 6.9 10*3/uL (ref 4.0–10.5)
nRBC: 0 % (ref 0.0–0.2)

## 2021-12-07 LAB — CMP (CANCER CENTER ONLY)
ALT: 18 U/L (ref 0–44)
AST: 21 U/L (ref 15–41)
Albumin: 4.2 g/dL (ref 3.5–5.0)
Alkaline Phosphatase: 70 U/L (ref 38–126)
Anion gap: 6 (ref 5–15)
BUN: 11 mg/dL (ref 8–23)
CO2: 30 mmol/L (ref 22–32)
Calcium: 9.7 mg/dL (ref 8.9–10.3)
Chloride: 101 mmol/L (ref 98–111)
Creatinine: 1.26 mg/dL — ABNORMAL HIGH (ref 0.61–1.24)
GFR, Estimated: >60 mL/min (ref >60)
Glucose, Bld: 102 mg/dL — ABNORMAL HIGH (ref 70–99)
Potassium: 3.7 mmol/L (ref 3.5–5.1)
Sodium: 137 mmol/L (ref 135–145)
Total Bilirubin: 0.8 mg/dL (ref 0.3–1.2)
Total Protein: 7.2 g/dL (ref 6.5–8.1)

## 2021-12-07 NOTE — Progress Notes (Signed)
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   HISTORY OF PRESENTING ILLNESS:  Jon Ramirez 76 y.o. male with medical history significant for hypertension, hypothyroidism, skin cancer and prostate cancer. He is unaccompanied for this visit.   On review of the previous records, Jon Ramirez underwent ultrasound and biopsy of his prostate on 06/19/2015.  At that time, his PSA was 17.89, prostatic volume was 42.5 cc and PSA density was 0.42.  Biopsy revealed adenocarcinoma, Gleason score 3+3 = 6.  Patient was established with Dr. Franchot Gallo with [facility] at time of diagnosis.  Patient was referred to see radiation oncology for definitive radiation but patient declined to pursue any therapy at that time  On exam today, Jon Ramirez reports that his energy levels are at baseline.  He is able to complete all his daily activities on his own.  He has a good appetite without any noticeable weight changes.  He denies nausea, vomiting or abdominal pain.  He reports occasional episodes of constipation due to certain foods he eats.  He denies easy bruising or signs of active bleeding.  He denies any urinary symptoms at this time.  Patient denies fevers, chills, night sweats, shortness of breath, chest pain or cough.  He has no other complaints.  Rest of the 10 point ROS is below.  MEDICAL HISTORY:  Past Medical History:  Diagnosis Date   Hypertension    Hypothyroidism    Prostate cancer [facility])    Skin cancer     SURGICAL HISTORY: Past Surgical History:  Procedure Laterality Date   CATARACT EXTRACTION, BILATERAL     ESOPHAGOGASTRODUODENOSCOPY N/A 02/03/2015   Procedure: ESOPHAGOGASTRODUODENOSCOPY (EGD);  Surgeon: Wilford Corner, MD;  Location: [facility] ENDOSCOPY;  Service:  Endoscopy;  Laterality: N/A;    SOCIAL HISTORY: Social History   Socioeconomic History   Marital status: Divorced    Spouse name: Not on file   Number of children: Not on file   Years of education: Not on file   Highest education level: Not on file  Occupational History   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  Tobacco Use   Smoking status: Never   Smokeless tobacco: Never  Substance and Sexual Activity   Alcohol use: No   Drug use: No   Sexual activity: Not on file  Other Topics Concern   Not on file  Social History Narrative   Not on file   Social Determinants of Health   Financial Resource Strain: Not on file  Food Insecurity: Not on file  Transportation Needs: Not on file  Physical Activity: Not on file  Stress: Not on file  Social Connections: Not on file  Intimate Partner Violence: Not on file    FAMILY HISTORY: History reviewed. No pertinent family history.  ALLERGIES:  has No Known Allergies.  MEDICATIONS:  Current Outpatient Medications  Medication Sig Dispense Refill   chlorthalidone (HYGROTON) 25 MG tablet Take 12.5 mg by mouth daily.     levothyroxine (SYNTHROID) 100 MCG tablet 1 tablet in the morning on an empty stomach     Multiple Vitamin (MULTIVITAMIN) tablet Take 1 tablet by mouth daily.     tamsulosin (FLOMAX) 0.4 MG CAPS capsule Take 0.4 mg by mouth daily.     valsartan (DIOVAN) 160 MG tablet Take 160 mg  by mouth daily.     No current facility-administered medications for this visit.    REVIEW OF SYSTEMS:   Constitutional: ( - ) fevers, ( - )  chills , ( - ) night sweats Eyes: ( - ) blurriness of vision, ( - ) double vision, ( - ) watery eyes Ears, nose, mouth, throat, and face: ( - ) mucositis, ( - ) sore throat Respiratory: ( - ) cough, ( - ) dyspnea, ( - ) wheezes Cardiovascular: ( - ) palpitation, ( - ) chest discomfort, ( - ) lower extremity swelling Gastrointestinal:  ( - ) nausea, ( - ) heartburn, ( - ) change in bowel  habits Skin: ( - ) abnormal skin rashes Lymphatics: ( - ) new lymphadenopathy, ( - ) easy bruising Neurological: ( - ) numbness, ( - ) tingling, ( - ) new weaknesses Behavioral/Psych: ( - ) mood change, ( - ) new changes  All other systems were reviewed with the patient and are negative.  PHYSICAL EXAMINATION: ECOG PERFORMANCE STATUS: 0 - Asymptomatic  Vitals:   12/07/21 1126  BP: (!) 146/89  Pulse: (!) 107  Resp: 18  Temp: (!) 97.5 F (36.4 C)  SpO2: 97%   Filed Weights   12/07/21 1126  Weight: 195 lb (88.5 kg)    GENERAL: well appearing male in NAD  SKIN: skin color, texture, turgor are normal, no rashes or significant lesions EYES: conjunctiva are pink and non-injected, sclera clear OROPHARYNX: no exudate, no erythema; lips, buccal mucosa, and tongue normal  NECK: supple, non-tender LYMPH:  no palpable lymphadenopathy in the cervical or supraclavicular lymph nodes.  LUNGS: clear to auscultation and percussion with normal breathing effort HEART: regular rate & rhythm and no murmurs and no lower extremity edema ABDOMEN: soft, non-tender, non-distended, normal bowel sounds Musculoskeletal: no cyanosis of digits and no clubbing  PSYCH: alert & oriented x 3, fluent speech NEURO: no focal motor/sensory deficits  LABORATORY DATA:  I have reviewed the data as listed CBC Latest Ref Rng & Units 12/07/2021 02/05/2015 02/04/2015  WBC 4.0 - 10.5 K/uL 6.9 6.1 8.1  Hemoglobin 13.0 - 17.0 g/dL 15.0 9.0(L) 8.5(L)  Hematocrit 39.0 - 52.0 % 41.3 26.5(L) 25.0(L)  Platelets 150 - 400 K/uL 249 428(H) 413(H)    CMP Latest Ref Rng & Units 02/04/2015 02/04/2015 02/03/2015  Glucose 70 - 99 mg/dL 89 88 136(H)  BUN 6 - 23 mg/dL 9 9 11   Creatinine 0.50 - 1.35 mg/dL 0.98 0.97 1.16  Sodium 135 - 145 mmol/L 139 138 140  Potassium 3.5 - 5.1 mmol/L 3.1(L) 3.1(L) 2.5(LL)  Chloride 96 - 112 mmol/L 108 107 99  CO2 19 - 32 mmol/L 22 26 29   Calcium 8.4 - 10.5 mg/dL 8.0(L) 8.0(L) 8.5  Total Protein 6.0 -  8.3 g/dL - 4.7(L) 6.4  Total Bilirubin 0.3 - 1.2 mg/dL - 0.9 0.9  Alkaline Phos 39 - 117 U/L - 64 83  AST 0 - 37 U/L - 13 21  ALT 0 - 53 U/L - 11 15     RADIOGRAPHIC STUDIES: I have personally reviewed the radiological images as listed and agreed with the findings in the report. No results found.  ASSESSMENT & PLAN Jon Ramirez is a 76 y.o. presents to the clinic to establish care for history of prostate cancer that was diagnosed in 2016.  Patient has establish care with Dr. Franchot Gallo [facility].  Patient declined therapy when he was initially diagnosed due to risk of long-term radiation  toxicities including incontinence.  He elected to undergo surveillance until now.  The recommendation is to proceed with new baseline imaging to determine staging.  Additionally, we recommend serologic evaluation today and repeat PSA levels.  If patient has localized disease, we recommend a consultation with radiation oncology to review the risks versus benefits of radiation therapy.  If patient has metastatic disease or declines radiation/surgical interventions, we will see the patient back to discuss systemic options.   #Prostate adenocarcinoma (Gleason score 3+3=6) --Need new baseline CT CAP to determine staging --If localized disease, plan to send referral to radiation oncology --If metastatic disease, plan to discuss system therapies including ADT.  --Labs today to check CBC, CMP and PSA levels.  --RTC after diagnostic testing is complete.    Orders Placed This Encounter  Procedures   CT CHEST ABDOMEN PELVIS W CONTRAST    Standing Status:   Future    Standing Expiration Date:   12/07/2022    Order Specific Question:   Preferred imaging location?    Answer:   [facility]    Order Specific Question:   Is Oral Contrast requested for this exam?    Answer:   Yes, Per Radiology protocol   CBC with Differential ([place] Only)    Standing Status:   Future    Number of  Occurrences:   1    Standing Expiration Date:   12/07/2022   CMP ([facility] only)    Standing Status:   Future    Number of Occurrences:   1    Standing Expiration Date:   12/07/2022   Prostate-Specific AG, Serum    Standing Status:   Future    Number of Occurrences:   1    Standing Expiration Date:   12/07/2022    All questions were answered. The patient knows to call the clinic with any problems, questions or concerns.  I have spent a total of 60 minutes minutes of face-to-face and non-face-to-face time, preparing to see the patient, obtaining and/or reviewing separately obtained history, performing a medically appropriate examination, counseling and educating the patient, ordering tests,  documenting clinical information in the electronic health record, and care coordination.   Dede Query, PA-C Department of Hematology/Oncology [place] at [facility] Phone: [phone]  Patient was seen with Dr. Lorenso Courier.   I have read the above note and personally examined the patient. I agree with the assessment and plan as noted above.  Briefly Jon Ramirez is a 76 year old male who presents for evaluation of previously diagnosed prostate cancer.  He was diagnosed in approximately 2016 but decided not to have any intervention performed at that time.  He was noted to be a Gleason 3+3 and opted for no further intervention at that time.  Given the period of time it has been since his diagnosis I would recommend that we proceed with a CT chest abdomen pelvis in order to assure no evidence of metastatic disease.  If no metastatic disease is found would recommend referral to radiation oncology for further discussion.  Medical management could be considered if the patient declined both surgical or radiological intervention for his prostate cancer.  If metastatic disease is found we will opt for medical management including androgen deprivation therapy.  The patient voices  understanding of the plan.   Ledell Peoples, MD Department of Hematology/Oncology [place] at [facility] Phone: [phone] Pager: [phone] Email: [email or web address]

## 2021-12-08 ENCOUNTER — Telehealth: Payer: Self-pay | Admitting: Physician Assistant

## 2021-12-08 ENCOUNTER — Ambulatory Visit (HOSPITAL_COMMUNITY)
Admission: RE | Admit: 2021-12-08 | Discharge: 2021-12-08 | Disposition: A | Discharge status: Home / Self Care | Payer: Medicare Other | Source: Ambulatory Visit | Attending: Physician Assistant | Admitting: Physician Assistant

## 2021-12-08 DIAGNOSIS — R918 Other nonspecific abnormal finding of lung field: Secondary | ICD-10-CM | POA: Diagnosis not present

## 2021-12-08 DIAGNOSIS — C61 Malignant neoplasm of prostate: Secondary | ICD-10-CM | POA: Insufficient documentation

## 2021-12-08 DIAGNOSIS — I2510 Atherosclerotic heart disease of native coronary artery without angina pectoris: Secondary | ICD-10-CM | POA: Diagnosis not present

## 2021-12-08 DIAGNOSIS — K8020 Calculus of gallbladder without cholecystitis without obstruction: Secondary | ICD-10-CM | POA: Diagnosis not present

## 2021-12-08 DIAGNOSIS — N3289 Other specified disorders of bladder: Secondary | ICD-10-CM | POA: Diagnosis not present

## 2021-12-08 DIAGNOSIS — I700 Atherosclerosis of aorta: Secondary | ICD-10-CM | POA: Diagnosis not present

## 2021-12-08 DIAGNOSIS — K4090 Unilateral inguinal hernia, without obstruction or gangrene, not specified as recurrent: Secondary | ICD-10-CM | POA: Diagnosis not present

## 2021-12-08 LAB — PROSTATE-SPECIFIC AG, SERUM (LABCORP): Prostate Specific Ag, Serum: 20.4 ng/mL — ABNORMAL HIGH (ref 0.0–4.0)

## 2021-12-08 MED ORDER — IOHEXOL 9 MG/ML PO SOLN
ORAL | Status: AC
  Filled 2021-12-08: qty 1000

## 2021-12-08 MED ORDER — IOHEXOL 300 MG/ML  SOLN
100.0000 mL | Freq: Once | INTRAMUSCULAR | Status: AC | PRN
  Administered 2021-12-08: 100 mL via INTRAVENOUS

## 2021-12-08 MED ORDER — IOHEXOL 9 MG/ML PO SOLN
500.0000 mL | ORAL | Status: AC
  Administered 2021-12-08: 1000 mL via ORAL

## 2021-12-08 MED ORDER — SODIUM CHLORIDE (PF) 0.9 % IJ SOLN
INTRAMUSCULAR | Status: AC
  Filled 2021-12-08: qty 50

## 2021-12-08 NOTE — Telephone Encounter (Signed)
I called and spoke to Mr. Bright Spielmann to review the labs and CT scan results. CT scan shows no definitive evidence of metastatic disease. There is a subtle ill defined sclerosis in the posterior left iliac bone. Labs show PSA level elevated to 20.4 ng/mL, which has been stable since 2021 per record review.   We recommend to proceed with nuclear bone scan to rule out osseous metastases. We suggest a referral to radiation oncology to discuss radiation therapy.   Patient expressed understanding with the plan provided

## 2021-12-09 ENCOUNTER — Telehealth: Payer: Self-pay | Admitting: Physician Assistant

## 2021-12-09 ENCOUNTER — Telehealth: Payer: Self-pay

## 2021-12-09 ENCOUNTER — Other Ambulatory Visit: Payer: Self-pay | Admitting: Physician Assistant

## 2021-12-09 NOTE — Telephone Encounter (Signed)
I called the patient to confirm the bone scan was scheduled for 12/13/2021.  Patient would like to wait to undergo additional imaging until he discusses treatment options with the radiation oncologist. We will reach out to the radiation team to expedite a consultation.

## 2021-12-09 NOTE — Telephone Encounter (Signed)
I have placed a consultation with radiation oncology  Can you help me get that scheduled? Specifically with Dr. Tammi Klippel   Referral faxed and confirmation rec'd

## 2021-12-13 ENCOUNTER — Encounter (HOSPITAL_COMMUNITY): Payer: Medicare Other

## 2021-12-14 DIAGNOSIS — E039 Hypothyroidism, unspecified: Secondary | ICD-10-CM | POA: Insufficient documentation

## 2021-12-14 DIAGNOSIS — N189 Chronic kidney disease, unspecified: Secondary | ICD-10-CM | POA: Insufficient documentation

## 2021-12-14 DIAGNOSIS — Z85828 Personal history of other malignant neoplasm of skin: Secondary | ICD-10-CM | POA: Insufficient documentation

## 2021-12-14 DIAGNOSIS — N401 Enlarged prostate with lower urinary tract symptoms: Secondary | ICD-10-CM | POA: Insufficient documentation

## 2021-12-14 NOTE — Progress Notes (Signed)
GU Location of Tumor / Histology: Prostate Adenocarcinoma  If Prostate Cancer, Gleason Score is (3 + 3) and PSA is (17.89 as of 06/19/2015)  Biopsies  Ultrasound and biopsy of his prostate on 06/19/2015.  At that time, his PSA was 17.89, prostatic volume was 42.5 cc and PSA density was 0.42.  Biopsy revealed adenocarcinoma, Gleason score 3+3 = 6.  Patient was established with Dr. Franchot Gallo with [facility] at time of diagnosis.  Patient was referred to see radiation oncology for definitive radiation but patient declined to pursue any therapy at that time.  Past/Anticipated interventions by urology, if any:   Weight changes, if any:  No  IPSS:  16 SHIM:  18  Bowel/Bladder complaints, if any:  No bowel and bladder frequency.  Nausea/Vomiting, if any:  No  Pain issues, if any:  0/10  SAFETY ISSUES: Prior radiation?  No Pacemaker/ICD?  No Possible current pregnancy?  Male Is the patient on methotrexate?  No  Current Complaints / other details:  More information on treatment options.

## 2021-12-15 ENCOUNTER — Ambulatory Visit
Admission: RE | Admit: 2021-12-15 | Discharge: 2021-12-15 | Disposition: A | Discharge status: Home / Self Care | Payer: Medicare Other | Source: Ambulatory Visit | Attending: Radiation Oncology | Admitting: Radiation Oncology

## 2021-12-15 ENCOUNTER — Other Ambulatory Visit: Payer: Self-pay

## 2021-12-15 VITALS — BP 93/75 | HR 122 | Temp 97.9°F | Resp 20 | Ht 72.0 in | Wt 193.0 lb

## 2021-12-15 DIAGNOSIS — C61 Malignant neoplasm of prostate: Secondary | ICD-10-CM | POA: Insufficient documentation

## 2021-12-15 DIAGNOSIS — I10 Essential (primary) hypertension: Secondary | ICD-10-CM | POA: Diagnosis not present

## 2021-12-15 DIAGNOSIS — Z85828 Personal history of other malignant neoplasm of skin: Secondary | ICD-10-CM | POA: Diagnosis not present

## 2021-12-15 DIAGNOSIS — Z7901 Long term (current) use of anticoagulants: Secondary | ICD-10-CM | POA: Insufficient documentation

## 2021-12-15 DIAGNOSIS — E039 Hypothyroidism, unspecified: Secondary | ICD-10-CM | POA: Insufficient documentation

## 2021-12-15 NOTE — Progress Notes (Addendum)
°Radiation Oncology         [phone] °________________________________ ° °Initial Outpatient Consultation ° °Name: Jon Ramirez MRN: [id number]  °Date: 12/15/2021  DOB: 11/16/1946 ° °CC:Swayne, David, MD  Shadad, Firas N, MD  ° °REFERRING PHYSICIAN: Shadad, Firas N, MD ° °DIAGNOSIS: 75 y.o. gentleman with Stage T1c adenocarcinoma of the prostate with Gleason score of 3+3 (biopsy from 2016), and current PSA of 20.4. ° °  ICD-10-CM   °1. Prostate cancer (HCC)  C61   °  ° ° °HISTORY OF PRESENT ILLNESS: Jon Ramirez is a 75 y.o. male with a diagnosis of prostate cancer. He was initially noted to have an elevated PSA of 6.54 by his primary care physician, Dr. Swayne in 2015.  Accordingly, he was referred for evaluation in urology by Dr. Dahlstedt at that time,  digital rectal examination was performed at that time revealing no discrete nodularity or concerning findings.  A prostate biopsy was recommended but the patient did not follow through.  He returned for follow-up in urology in 2016 with a PSA of 17.89.  The patient proceeded to transrectal ultrasound with 12 biopsies of the prostate on 06/19/2015.  The prostate volume measured 42 cc.  Out of 12 core biopsies, 7 were positive.  The maximum Gleason score was 3+3, and this was seen in all 6 cores on the right and 1 core on the left.  He was referred to radiation oncology at that time, to discuss treatment options, but declined pursuing any treatment.  The patient read about some of the newer treatments in prostate cancer and asked his primary care doctor to refer him to an oncologist to discuss.  He met with Irene Thayil, PA-C and repeat PSA was 20.4 on 12/07/21.  He had CT chest/abdomen/pelvis on 12/08/21 which did not show any evidence of metastatic prostate cancer. He was scheduled for Bone scan as well but cancelled this imaging until further discussion with us today. ° °The patient reviewed the lab and scan results with his Irene Thayil, PA-C and he has  kindly been referred today for discussion of potential radiation treatment options. ° ° °PREVIOUS RADIATION THERAPY: No ° °PAST MEDICAL HISTORY:  °Past Medical History:  °Diagnosis Date  ° Hypertension   ° Hypothyroidism   ° Prostate cancer (HCC)   ° Skin cancer   °   ° °PAST SURGICAL HISTORY: °Past Surgical History:  °Procedure Laterality Date  ° CATARACT EXTRACTION, BILATERAL    ° ESOPHAGOGASTRODUODENOSCOPY N/A 02/03/2015  ° Procedure: ESOPHAGOGASTRODUODENOSCOPY (EGD);  Surgeon: Vincent Schooler, MD;  Location: [facility];  Service: Endoscopy;  Laterality: N/A;  ° ° °FAMILY HISTORY: No family history on file. 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  °Tobacco Use  ° Smoking status: Never  ° Smokeless tobacco: Never  °Substance and Sexual Activity  ° Alcohol use: No  ° Drug use: No  ° Sexual activity: Not on file  °Other Topics Concern  ° Not on file  °Social History Narrative  ° Not on file  ° °Social Determinants of Health  ° °Financial Resource Strain: Not on file  °Food Insecurity: Not on file  °Transportation Needs: Not on file  °Physical Activity: Not   on file  °Stress: Not on file  °Social Connections: Not on file  °Intimate Partner Violence: Not on file  ° ° °ALLERGIES: Patient has no known allergies. ° °MEDICATIONS:  °Current Outpatient Medications  °Medication Sig Dispense Refill  ° chlorthalidone (HYGROTON) 25 MG tablet Take 12.5 mg by mouth daily.    ° levothyroxine (SYNTHROID) 100 MCG tablet 1 tablet in the morning on an empty stomach    ° Multiple Vitamin (MULTIVITAMIN) tablet Take 1 tablet by mouth daily.    ° tamsulosin (FLOMAX) 0.4 MG CAPS capsule Take 0.4 mg by mouth daily.    ° valsartan (DIOVAN) 160 MG tablet Take 160 mg by mouth  daily.    ° °No current facility-administered medications for this visit.  ° °REVIEW OF SYSTEMS:  On review of systems, the patient reports that he is doing well overall. He denies any chest pain, shortness of breath, cough, fevers, chills, night sweats, unintended weight changes. He denies any bowel disturbances, and denies abdominal pain, nausea or vomiting. He denies any new musculoskeletal or joint aches or pains. His IPSS was Total Score: 16, indicating moderate urinary symptoms. His SHIM: 18, indicating he does not significant erectile dysfunction. A complete review of systems is obtained and is otherwise negative. ° ° °  °PHYSICAL EXAM:  °Wt Readings from Last 3 Encounters:  °12/07/21 195 lb (88.5 kg)  °11/23/18 198 lb 3.2 oz (89.9 kg)  °02/04/15 177 lb 7.5 oz (80.5 kg)  ° °Temp Readings from Last 3 Encounters:  °12/07/21 (!) 97.5 °F (36.4 °C) (Temporal)  °12/16/20 97.9 °F (36.6 °C) (Tympanic)  °02/05/15 98.7 °F (37.1 °C) (Oral)  ° °BP Readings from Last 3 Encounters:  °12/07/21 (!) 146/89  °11/23/18 126/80  °02/05/15 132/70  ° °Pulse Readings from Last 3 Encounters:  °12/07/21 (!) 107  °11/23/18 91  °02/05/15 92  ° ° /10 ° °In general this is a well appearing Caucasian male in no acute distress. He's alert and oriented x4 and appropriate throughout the examination. Cardiopulmonary assessment is negative for acute distress, and he exhibits normal effort.   ° ° °KPS = 100 ° °100 - Normal; no complaints; no evidence of disease. °90   - Able to carry on normal activity; minor signs or symptoms of disease. °80   - Normal activity with effort; some signs or symptoms of disease. °70   - Cares for self; unable to carry on normal activity or to do active work. °60   - Requires occasional assistance, but is able to care for most of his personal needs. °50   - Requires considerable assistance and frequent medical care. °40   - Disabled; requires special care and assistance. °30   - Severely disabled; hospital admission  is indicated although death not imminent. °20   - Very sick; hospital admission necessary; active supportive treatment necessary. °10   - Moribund; fatal processes progressing rapidly. °0     - Dead ° °Karnofsky DA, Abelmann WH, Craver LS and Burchenal JH (1946) The use of the nitrogen mustards in the palliative treatment of carcinoma: with particular reference to bronchogenic carcinoma Cancer 1 634-56 ° °LABORATORY DATA:  °Lab Results  °Component Value Date  ° WBC 6.9 12/07/2021  ° HGB 15.0 12/07/2021  ° HCT 41.3 12/07/2021  ° MCV 87.1 12/07/2021  ° PLT 249 12/07/2021  ° °Lab Results  °Component Value Date  ° NA 137 12/07/2021  ° K 3.7 12/07/2021  ° CL 101 12/07/2021  ° CO2 30 12/07/2021  ° °  12/07/2021   Lab Results  Component Value Date   ALT 18 12/07/2021   AST 21 12/07/2021   ALKPHOS 70 12/07/2021   BILITOT 0.8 12/07/2021     RADIOGRAPHY: CT CHEST ABDOMEN PELVIS W CONTRAST  Result Date: 12/08/2021 CLINICAL DATA:  Prostate cancer.  Staging. EXAM: CT CHEST, ABDOMEN, AND PELVIS WITH CONTRAST TECHNIQUE: Multidetector CT imaging of the chest, abdomen and pelvis was performed following the standard protocol during bolus administration of intravenous contrast. RADIATION DOSE REDUCTION: This exam was performed according to the departmental dose-optimization program which includes automated exposure control, adjustment of the mA and/or kV according to patient size and/or use of iterative reconstruction technique. CONTRAST:  126m OMNIPAQUE IOHEXOL 300 MG/ML  SOLN COMPARISON:  CTA Chest 02/03/2015 FINDINGS: CT CHEST FINDINGS Cardiovascular: The heart size is normal. No substantial pericardial effusion. Coronary artery calcification is evident. Mild atherosclerotic calcification is noted in the wall of the thoracic aorta. Mediastinum/Nodes: No mediastinal lymphadenopathy. There is no hilar lymphadenopathy. The esophagus has normal imaging features. There is no axillary lymphadenopathy. Lungs/Pleura: 4 mm nodule posterior  right lower lobe on 119/4 was not visible on the previous study. No suspicious pulmonary nodule or mass. No focal airspace consolidation. There is no evidence of pleural effusion. Musculoskeletal: No worrisome lytic or sclerotic osseous abnormality. CT ABDOMEN PELVIS FINDINGS Hepatobiliary: No suspicious focal abnormality within the liver parenchyma. Several calcified gallstones evident measuring up to about 12 mm maximum size. No intrahepatic or extrahepatic biliary dilation. Pancreas: No focal mass lesion. No dilatation of the main duct. No intraparenchymal cyst. No peripancreatic edema. Spleen: No splenomegaly. No focal mass lesion. Adrenals/Urinary Tract: No adrenal nodule or mass. Kidneys unremarkable. No evidence for hydroureter. Bladder is poorly distended which may account for the appearance of circumferential bladder wall thickening. Stomach/Bowel: Stomach is unremarkable. No gastric wall thickening. No evidence of outlet obstruction. Duodenum is normally positioned as is the ligament of Treitz. No small bowel wall thickening. No small bowel dilatation. The terminal ileum is normal. The appendix is normal. No gross colonic mass. No colonic wall thickening. Vascular/Lymphatic: There is mild atherosclerotic calcification of the abdominal aorta without aneurysm. There is no gastrohepatic or hepatoduodenal ligament lymphadenopathy. No retroperitoneal or mesenteric lymphadenopathy. No pelvic sidewall lymphadenopathy. Reproductive: Prostate gland is enlarged. Other: No intraperitoneal free fluid. Musculoskeletal: Small right groin hernia contains only fat. There is subtle sclerosis in the posterior left iliac bone (see axial image 96 of series 2), indeterminate.Otherwise, no worrisome lytic or sclerotic osseous abnormality. L1 compression deformity appears nonacute. IMPRESSION: 1. No definite findings to suggest metastatic disease in the chest, abdomen, or pelvis. Specifically, no lymphadenopathy in the abdomen  or pelvis. 2. Subtle ill-defined sclerosis in the posterior left iliac bone is indeterminate. Although not overtly suspicious for metastatic involvement, this possibility cannot be completely excluded. Nuclear bone scan may prove helpful to determine whether there is active bony turnover at this location. Otherwise no suspicious lytic or sclerotic osseous abnormality on today's study. 3. 4 mm right lower lobe pulmonary nodule. Likely benign. Attention on follow-up recommended. 4. Small right groin hernia contains only fat 5.  Aortic Atherosclerois (ICD10-170.0) Electronically Signed   By: EMisty StanleyM.D.   On: 12/08/2021 12:25      IMPRESSION/PLAN: 1. 76y.o. gentleman with Stage T1c adenocarcinoma of the prostate with Gleason score of 3+3 (biopsy from 2016), and current PSA of 20.4.  We discussed the patient's workup and outlined the nature of prostate cancer in this setting. The patient's 76 year  old biopsy with Gleason's score of 6 is favorable, but, technically, his PSA above 20 puts him into the high risk group. Prior to proceeding with definitive treatment recommendations, I have recommended that he return to Dr. Diona Fanti for probably prostate MRI and updated prostate biopsy.  I explained that cancer can progress and become more aggressive over time, and 6 years has elapsed.  I explained that with active surveillance, we recommend repeat prostate biopsy every 12-18 months to update extent of involvement and Gleason's score.  He expressed some trepidation about cancer treatment when he was first diagnosed and admits that he wanted to hear more about it prior to deciding what to do.  I did caution him that allowing prostate cancer to progress without 'active surveillance' or treatment may put him at risk of developing painful metastatic disease.  I made it very clear that my recommendation was to get a biopsy.  However, I also explained the choice was his, and as his care providers, we would do our best  to support him whatever his decision.  If his disease is still localized he may be eligible for a variety of potential treatment options including  brachytherapy, 5.5-8 weeks of external radiation, or prostatectomy. We discussed the available radiation techniques, and focused on the details and logistics of delivery.  We discussed and outlined the risks, benefits, short and long-term effects associated with radiotherapy and compared and contrasted these with prostatectomy. We discussed the role of SpaceOAR gel in reducing the rectal toxicity associated with radiotherapy.   He appears to have a good understanding of possible options pending updated biopsy and staging.  He was encouraged to ask questions that were answered to his stated satisfaction.  At the conclusion of our conversation, the patient is leaning toward contacting Dr. Diona Fanti for updated diagnostic work-up and still needs a bone scan to complete his disease restaging.  We personally spent 60 minutes in this encounter including chart review, reviewing radiological studies, meeting face-to-face with the patient, entering orders and completing documentation.    Nicholos Johns, PA-C    Tyler Pita, MD  [facility] Direct Dial: [phone]   Fax: [phone] [email or web address]   Skype   [email or web address]

## 2021-12-15 NOTE — Addendum Note (Signed)
Encounter addended by: Freeman Caldron, PA-C on: 12/15/2021 4:52 PM  Actions taken: Clinical Note Signed

## 2021-12-28 ENCOUNTER — Telehealth: Payer: Self-pay

## 2021-12-28 NOTE — Telephone Encounter (Signed)
Left message for patient to call back  

## 2022-01-03 NOTE — Progress Notes (Signed)
RN spoke to patient to follow up regarding recommendations to see urology for prostate biopsy.    Patient stated, "I do not want to do anything about my prostate cancer, if I decide to do a biopsy I will contact Dr. Alan Ripper office myself."   RN encouraged patient to call if he needs anything additionally from [place].  Dede Query, PA-C notified.

## 2022-09-27 DIAGNOSIS — Z1159 Encounter for screening for other viral diseases: Secondary | ICD-10-CM | POA: Diagnosis not present

## 2022-09-27 DIAGNOSIS — Z85828 Personal history of other malignant neoplasm of skin: Secondary | ICD-10-CM | POA: Diagnosis not present

## 2022-09-27 DIAGNOSIS — I10 Essential (primary) hypertension: Secondary | ICD-10-CM | POA: Diagnosis not present

## 2022-09-27 DIAGNOSIS — N189 Chronic kidney disease, unspecified: Secondary | ICD-10-CM | POA: Diagnosis not present

## 2022-09-27 DIAGNOSIS — I700 Atherosclerosis of aorta: Secondary | ICD-10-CM | POA: Diagnosis not present

## 2022-09-27 DIAGNOSIS — E039 Hypothyroidism, unspecified: Secondary | ICD-10-CM | POA: Diagnosis not present

## 2022-09-27 DIAGNOSIS — Z0000 Encounter for general adult medical examination without abnormal findings: Secondary | ICD-10-CM | POA: Diagnosis not present

## 2022-11-28 DIAGNOSIS — E785 Hyperlipidemia, unspecified: Secondary | ICD-10-CM | POA: Diagnosis not present

## 2022-11-28 DIAGNOSIS — E039 Hypothyroidism, unspecified: Secondary | ICD-10-CM | POA: Diagnosis not present

## 2023-03-02 DIAGNOSIS — E039 Hypothyroidism, unspecified: Secondary | ICD-10-CM | POA: Diagnosis not present

## 2023-05-09 DIAGNOSIS — E039 Hypothyroidism, unspecified: Secondary | ICD-10-CM | POA: Diagnosis not present

## 2023-09-26 DIAGNOSIS — E7800 Pure hypercholesterolemia, unspecified: Secondary | ICD-10-CM | POA: Diagnosis not present

## 2023-09-26 DIAGNOSIS — I700 Atherosclerosis of aorta: Secondary | ICD-10-CM | POA: Diagnosis not present

## 2023-09-26 DIAGNOSIS — E039 Hypothyroidism, unspecified: Secondary | ICD-10-CM | POA: Diagnosis not present

## 2023-09-26 DIAGNOSIS — I10 Essential (primary) hypertension: Secondary | ICD-10-CM | POA: Diagnosis not present

## 2024-01-03 IMAGING — CT CT CHEST-ABD-PELV W/ CM
3 of 5 series · 14 of 36 positions shown, 16 images · IV contrast (OMNIPAQUE)
Comparison: CTA Chest 02/03/2015

CLINICAL DATA: Prostate cancer.  Staging.

EXAM:
CT CHEST, ABDOMEN, AND PELVIS WITH CONTRAST
TECHNIQUE: Multidetector CT imaging of the chest, abdomen and pelvis was
performed following the standard protocol during bolus
administration of intravenous contrast.

[Series 2: cap with · axial · 0.85mm/px · z∈[-314,+206]mm · 9 of 131 slices shown, 11 images]
[im 14/131  mediastinal]
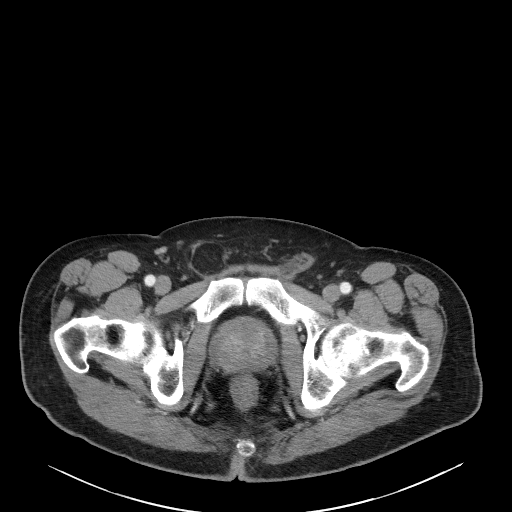
[im 14/131  bone]
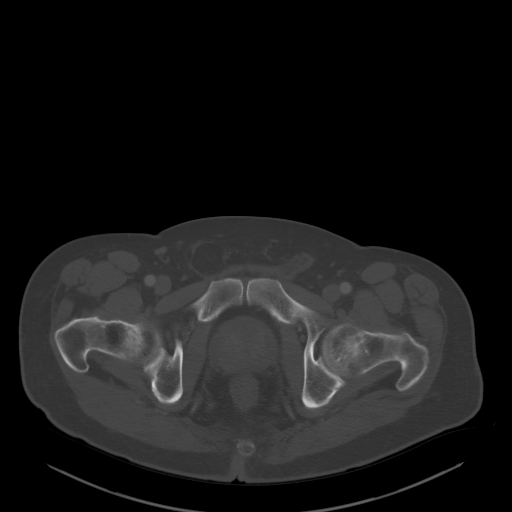
[im 27/131  mediastinal]
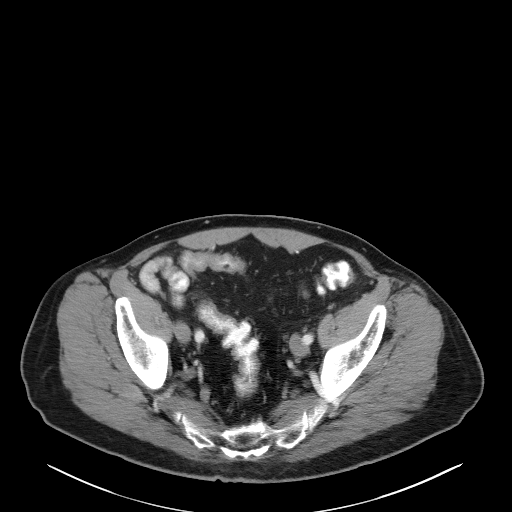
[im 40/131  mediastinal]
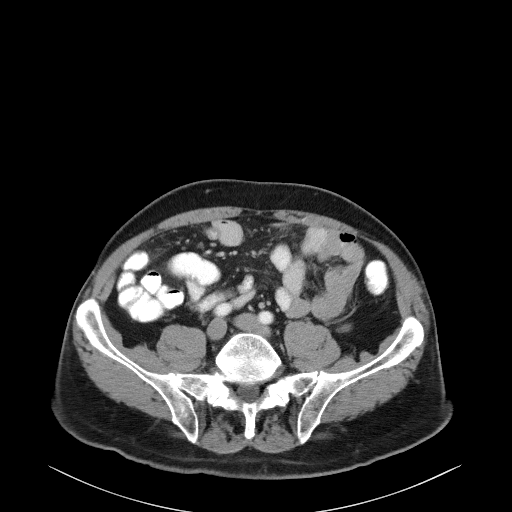
[im 53/131  mediastinal]
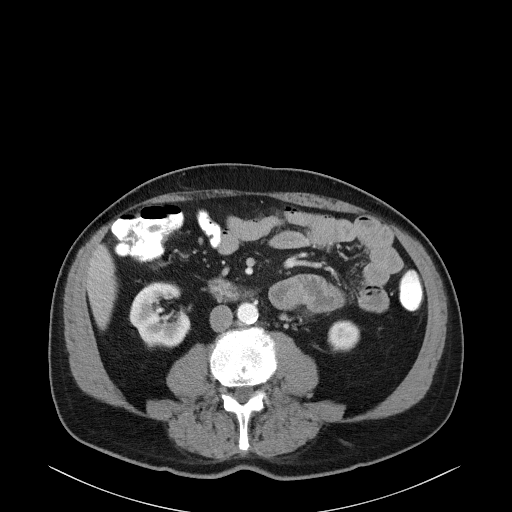
[im 66/131  mediastinal]
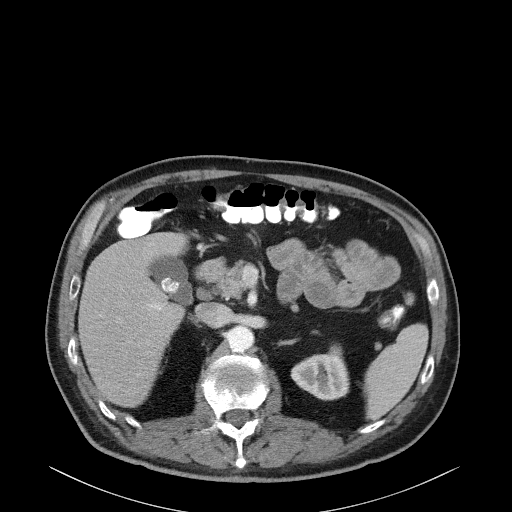
[im 79/131  mediastinal]
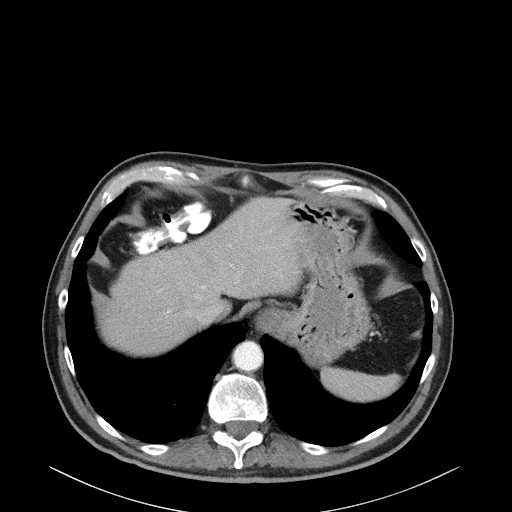
[im 92/131  mediastinal]
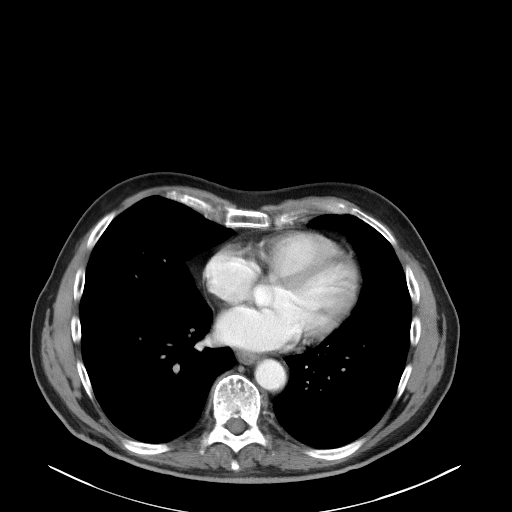
[im 105/131  mediastinal]
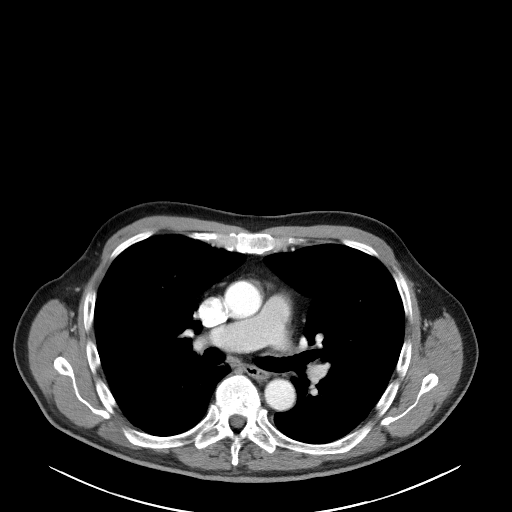
[im 118/131  mediastinal]
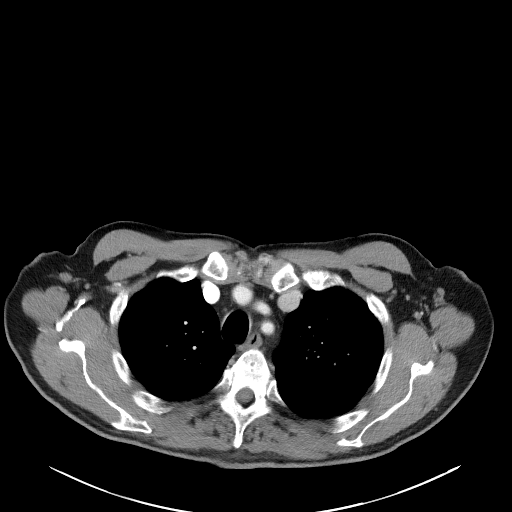
[im 118/131  bone]
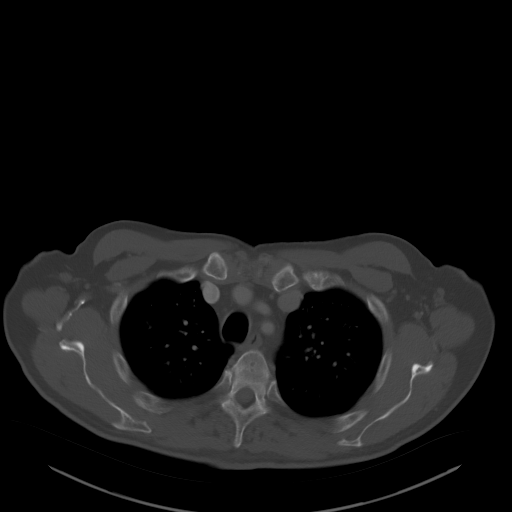

[Series 4: lung · axial · 0.85mm/px · z∈[-30,+18]mm · 2 of 159 slices shown]
[im 13/159  bone]
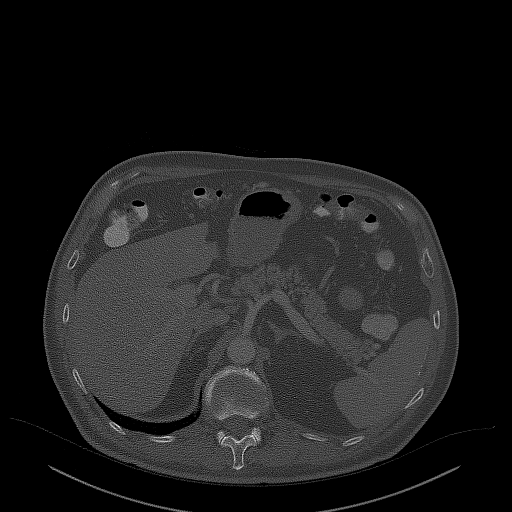
[im 37/159  bone]
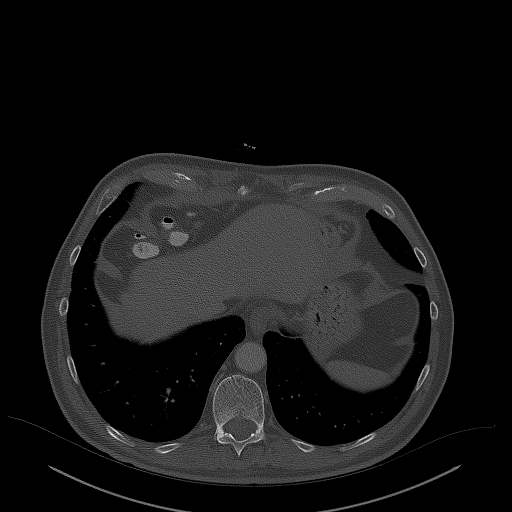

[Series 5: coronals · coronal · 0.97mm/px · 3 of 134 slices shown]
[im 27/134  mediastinal]
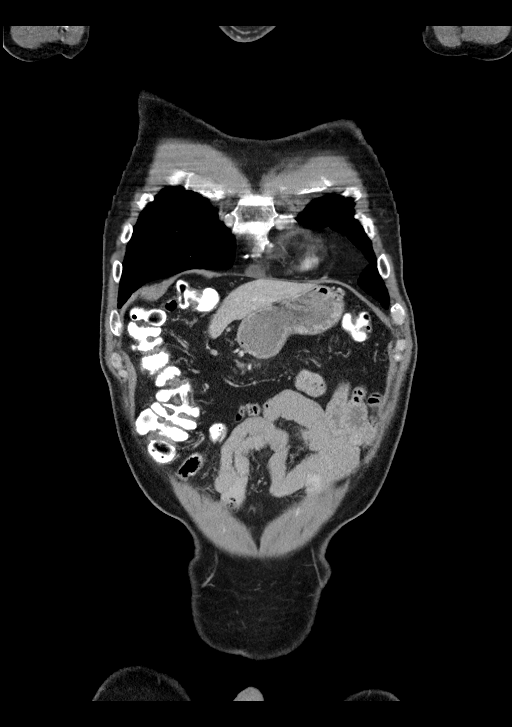
[im 54/134  mediastinal]
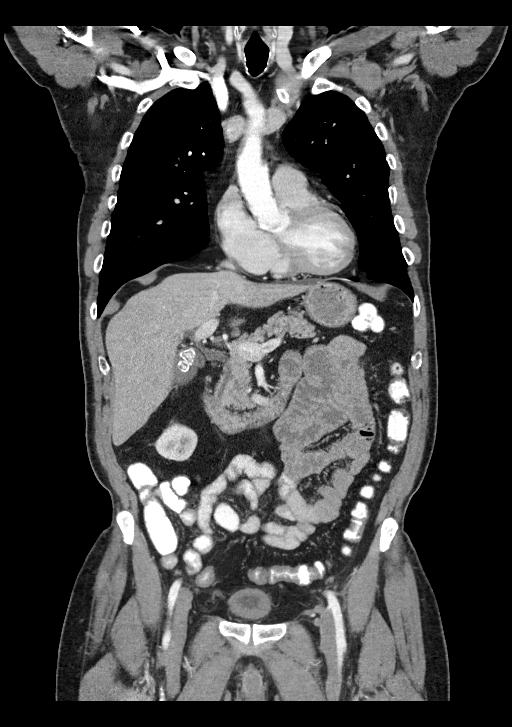
[im 80/134  mediastinal]
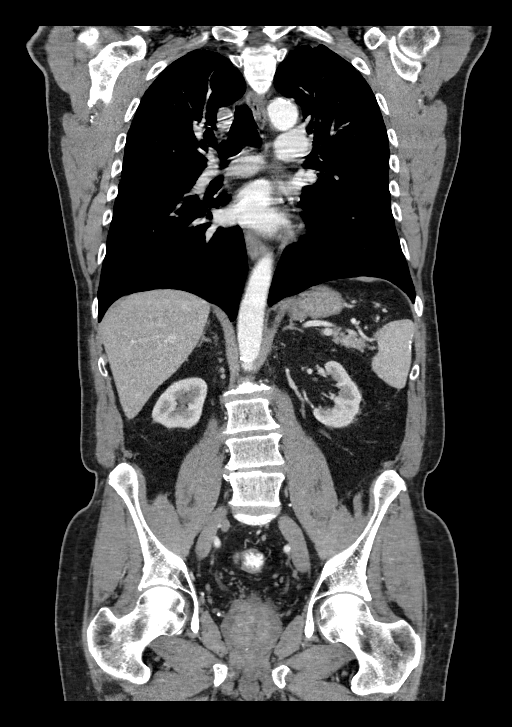

[14 of 36 positions shown; findings below may reference images not displayed]

RADIATION DOSE REDUCTION: This exam was performed according to the
departmental dose-optimization program which includes automated
exposure control, adjustment of the mA and/or kV according to
patient size and/or use of iterative reconstruction technique.

CONTRAST:  100mL OMNIPAQUE IOHEXOL 300 MG/ML  SOLN
FINDINGS: CT CHEST FINDINGS

Cardiovascular: The heart size is normal. No substantial pericardial
effusion. Coronary artery calcification is evident. Mild
atherosclerotic calcification is noted in the wall of the thoracic
aorta.

Mediastinum/Nodes: No mediastinal lymphadenopathy. There is no hilar
lymphadenopathy. The esophagus has normal imaging features. There is
no axillary lymphadenopathy.

Lungs/Pleura: 4 mm nodule posterior right lower lobe on 119/4 was
not visible on the previous study. No suspicious pulmonary nodule or
mass. No focal airspace consolidation. There is no evidence of
pleural effusion.

Musculoskeletal: No worrisome lytic or sclerotic osseous
abnormality.

CT ABDOMEN PELVIS FINDINGS

Hepatobiliary: No suspicious focal abnormality within the liver
parenchyma. Several calcified gallstones evident measuring up to
about 12 mm maximum size. No intrahepatic or extrahepatic biliary
dilation.

Pancreas: No focal mass lesion. No dilatation of the main duct. No
intraparenchymal cyst. No peripancreatic edema.

Spleen: No splenomegaly. No focal mass lesion.

Adrenals/Urinary Tract: No adrenal nodule or mass. Kidneys
unremarkable. No evidence for hydroureter. Bladder is poorly
distended which may account for the appearance of circumferential
bladder wall thickening.

Stomach/Bowel: Stomach is unremarkable. No gastric wall thickening.
No evidence of outlet obstruction. Duodenum is normally positioned
as is the ligament of Treitz. No small bowel wall thickening. No
small bowel dilatation. The terminal ileum is normal. The appendix
is normal. No gross colonic mass. No colonic wall thickening.

Vascular/Lymphatic: There is mild atherosclerotic calcification of
the abdominal aorta without aneurysm. There is no gastrohepatic or
hepatoduodenal ligament lymphadenopathy. No retroperitoneal or
mesenteric lymphadenopathy. No pelvic sidewall lymphadenopathy.

Reproductive: Prostate gland is enlarged.

Other: No intraperitoneal free fluid.

Musculoskeletal: Small right groin hernia contains only fat. There
is subtle sclerosis in the posterior left iliac bone (see axial
image 96 of series 2), indeterminate.Otherwise, no worrisome lytic
or sclerotic osseous abnormality. L1 compression deformity appears
nonacute.
IMPRESSION: 1. No definite findings to suggest metastatic disease in the chest,
abdomen, or pelvis. Specifically, no lymphadenopathy in the abdomen
or pelvis.
2. Subtle ill-defined sclerosis in the posterior left iliac bone is
indeterminate. Although not overtly suspicious for metastatic
involvement, this possibility cannot be completely excluded. Nuclear
bone scan may prove helpful to determine whether there is active
bony turnover at this location. Otherwise no suspicious lytic or
sclerotic osseous abnormality on today's study.
3. 4 mm right lower lobe pulmonary nodule. Likely benign. Attention
on follow-up recommended.
4. Small right groin hernia contains only fat
5.  Aortic Atherosclerois ([id number]-170.0)

## 2024-03-18 DIAGNOSIS — Z1211 Encounter for screening for malignant neoplasm of colon: Secondary | ICD-10-CM | POA: Diagnosis not present

## 2024-03-19 DIAGNOSIS — I10 Essential (primary) hypertension: Secondary | ICD-10-CM | POA: Diagnosis not present

## 2024-03-19 DIAGNOSIS — Z1211 Encounter for screening for malignant neoplasm of colon: Secondary | ICD-10-CM | POA: Diagnosis not present

## 2024-03-19 DIAGNOSIS — E7800 Pure hypercholesterolemia, unspecified: Secondary | ICD-10-CM | POA: Diagnosis not present

## 2024-03-19 DIAGNOSIS — I700 Atherosclerosis of aorta: Secondary | ICD-10-CM | POA: Diagnosis not present

## 2024-03-19 DIAGNOSIS — Z1159 Encounter for screening for other viral diseases: Secondary | ICD-10-CM | POA: Diagnosis not present

## 2024-03-19 DIAGNOSIS — E039 Hypothyroidism, unspecified: Secondary | ICD-10-CM | POA: Diagnosis not present

## 2024-03-19 DIAGNOSIS — Z0000 Encounter for general adult medical examination without abnormal findings: Secondary | ICD-10-CM | POA: Diagnosis not present

## 2024-04-02 DIAGNOSIS — D124 Benign neoplasm of descending colon: Secondary | ICD-10-CM | POA: Diagnosis not present

## 2024-04-02 DIAGNOSIS — Z1211 Encounter for screening for malignant neoplasm of colon: Secondary | ICD-10-CM | POA: Diagnosis not present

## 2024-04-04 DIAGNOSIS — D124 Benign neoplasm of descending colon: Secondary | ICD-10-CM | POA: Diagnosis not present

## 2024-04-20 DIAGNOSIS — E7800 Pure hypercholesterolemia, unspecified: Secondary | ICD-10-CM | POA: Diagnosis not present

## 2024-04-20 DIAGNOSIS — I10 Essential (primary) hypertension: Secondary | ICD-10-CM | POA: Diagnosis not present

## 2024-04-20 DIAGNOSIS — E039 Hypothyroidism, unspecified: Secondary | ICD-10-CM | POA: Diagnosis not present

## 2024-07-21 DIAGNOSIS — E039 Hypothyroidism, unspecified: Secondary | ICD-10-CM | POA: Diagnosis not present

## 2024-07-21 DIAGNOSIS — I10 Essential (primary) hypertension: Secondary | ICD-10-CM | POA: Diagnosis not present

## 2024-07-21 DIAGNOSIS — E7800 Pure hypercholesterolemia, unspecified: Secondary | ICD-10-CM | POA: Diagnosis not present
